# Patient Record
Sex: Female | Born: 1985 | Race: White | Hispanic: No | Marital: Single | Smoking: Former smoker
Health system: Southern US, Community
[De-identification: ages and names within clinical notes are randomized; demographics above are authoritative.]

## PROBLEM LIST (undated history)

## (undated) DIAGNOSIS — F32A Depression, unspecified: Secondary | ICD-10-CM

## (undated) DIAGNOSIS — I10 Essential (primary) hypertension: Secondary | ICD-10-CM

## (undated) DIAGNOSIS — J398 Other specified diseases of upper respiratory tract: Secondary | ICD-10-CM

## (undated) HISTORY — DX: Other specified diseases of upper respiratory tract: J39.8

## (undated) HISTORY — PX: WISDOM TOOTH EXTRACTION: SHX21

---

## 2020-05-06 NOTE — L&D Delivery Note (Signed)
Delivery Summary for Vickie Carlson  Labor Events:   Preterm labor: No data found  Rupture date: 03/27/2021  Rupture time: 12:20 PM  Rupture type: Artificial Intact  Fluid Color: Clear  Induction: No data found  Augmentation: No data found  Complications: No data found  Cervical ripening: No data found No data found   No data found     Delivery:   Episiotomy: No data found  Lacerations: No data found  Repair suture: No data found  Repair # of packets: No data found  Blood loss (ml): 1500   Information for the patient's newborn:  Jya, Hughston [[id number]]   Delivery 03/27/2021 10:01 PM by  Vaginal, Spontaneous Sex:  female Gestational Age: [redacted]w[redacted]d Delivery Clinician:   Living?:         APGARS  One minute Five minutes Ten minutes  Skin color:        Heart rate:        Grimace:        Muscle tone:        Breathing:        Totals: 7  9      Presentation/position:      Resuscitation:   Cord information:    Disposition of cord blood:     Blood gases sent?  Complications:   Placenta: Delivered:       appearance Newborn Measurements: Weight: 5 lb 7.5 oz (2480 g)  Height: 18.11"  Head circumference:    Chest circumference:    Other providers:    Additional  information: Forceps:   Vacuum:   Breech:   Observed anomalies        Delivery Note At 10:01 PM a viable and healthy female was delivered via Vaginal, Spontaneous (Presentation:  Vertex; ROA position).  APGAR: 7, 9; weight  2480 grams.   Placenta status: Spontaneous, Intact.  Cord: 3 vessels with the following complications: Body cord x 1.  Cord pH: obtained, but not sent as fetal status improved rapidly. Delayed cord clamping observed for 1 minute.  Anesthesia: Epidural Episiotomy: None Lacerations: Sulcus (left), right vaginal tear Suture Repair: 2.0 3.0 vicryl Est. Blood Loss (mL): 1500  Mom to postpartum.  Baby to Couplet care / Skin to Skin.  Hildred Laser, MD 03/27/2021, 10:50 PM

## 2020-09-07 ENCOUNTER — Other Ambulatory Visit: Payer: Self-pay

## 2020-09-07 ENCOUNTER — Emergency Department
Admission: EM | Admit: 2020-09-07 | Discharge: 2020-09-07 | Disposition: A | Discharge status: Home / Self Care | Payer: Medicaid Other | Attending: Emergency Medicine | Admitting: Emergency Medicine

## 2020-09-07 DIAGNOSIS — I10 Essential (primary) hypertension: Secondary | ICD-10-CM | POA: Insufficient documentation

## 2020-09-07 DIAGNOSIS — Z3A01 Less than 8 weeks gestation of pregnancy: Secondary | ICD-10-CM | POA: Diagnosis not present

## 2020-09-07 DIAGNOSIS — R112 Nausea with vomiting, unspecified: Secondary | ICD-10-CM

## 2020-09-07 DIAGNOSIS — O219 Vomiting of pregnancy, unspecified: Secondary | ICD-10-CM | POA: Insufficient documentation

## 2020-09-07 HISTORY — DX: Essential (primary) hypertension: I10

## 2020-09-07 LAB — URINALYSIS, COMPLETE (UACMP) WITH MICROSCOPIC
Bacteria, UA: NONE SEEN
Bilirubin Urine: NEGATIVE
Glucose, UA: NEGATIVE mg/dL
Ketones, ur: NEGATIVE mg/dL
Leukocytes,Ua: NEGATIVE
Nitrite: NEGATIVE
Protein, ur: NEGATIVE mg/dL
Specific Gravity, Urine: 1.005 (ref 1.005–1.030)
pH: 7 (ref 5.0–8.0)

## 2020-09-07 LAB — COMPREHENSIVE METABOLIC PANEL
ALT: 14 U/L (ref 0–44)
AST: 19 U/L (ref 15–41)
Albumin: 4.2 g/dL (ref 3.5–5.0)
Alkaline Phosphatase: 55 U/L (ref 38–126)
Anion gap: 10 (ref 5–15)
BUN: 6 mg/dL (ref 6–20)
CO2: 20 mmol/L — ABNORMAL LOW (ref 22–32)
Calcium: 9.1 mg/dL (ref 8.9–10.3)
Chloride: 103 mmol/L (ref 98–111)
Creatinine, Ser: 0.52 mg/dL (ref 0.44–1.00)
GFR, Estimated: >60 mL/min (ref >60)
Glucose, Bld: 89 mg/dL (ref 70–99)
Potassium: 3.8 mmol/L (ref 3.5–5.1)
Sodium: 133 mmol/L — ABNORMAL LOW (ref 135–145)
Total Bilirubin: 0.7 mg/dL (ref 0.3–1.2)
Total Protein: 7.4 g/dL (ref 6.5–8.1)

## 2020-09-07 LAB — CBC
HCT: 36.8 % (ref 36.0–46.0)
Hemoglobin: 12.5 g/dL (ref 12.0–15.0)
MCH: 30.1 pg (ref 26.0–34.0)
MCHC: 34 g/dL (ref 30.0–36.0)
MCV: 88.7 fL (ref 80.0–100.0)
Platelets: 306 10*3/uL (ref 150–400)
RBC: 4.15 MIL/uL (ref 3.87–5.11)
RDW: 12.3 % (ref 11.5–15.5)
WBC: 11.7 10*3/uL — ABNORMAL HIGH (ref 4.0–10.5)
nRBC: 0 % (ref 0.0–0.2)

## 2020-09-07 LAB — LIPASE, BLOOD: Lipase: 27 U/L (ref 11–51)

## 2020-09-07 LAB — POC URINE PREG, ED: Preg Test, Ur: POSITIVE — AB

## 2020-09-07 MED ORDER — ONDANSETRON 4 MG PO TBDP: 4.0000 mg | ORAL_TABLET | Freq: Three times a day (TID) | ORAL | 0 refills | Status: AC | PRN

## 2020-09-07 MED ORDER — SODIUM CHLORIDE 0.9 % IV BOLUS
1000.0000 mL | Freq: Once | INTRAVENOUS | Status: AC
  Administered 2020-09-07: 1000 mL via INTRAVENOUS

## 2020-09-07 MED ORDER — ONDANSETRON HCL 4 MG/2ML IJ SOLN
4.0000 mg | Freq: Once | INTRAMUSCULAR | Status: AC
  Administered 2020-09-07: 4 mg via INTRAVENOUS
  Filled 2020-09-07: qty 2

## 2020-09-07 NOTE — ED Notes (Signed)
See triage note  Pt sleeping at this time

## 2020-09-07 NOTE — ED Notes (Signed)
Pt denies any nausea at this time, able to tolerate PO prior to discharge

## 2020-09-07 NOTE — ED Triage Notes (Signed)
Pt to ER via POV with complaints of emesis, reports she has had multiple episodes over the last week. Report fatigue and increased sleeping. Pt states that she feels dehydrated.  Pt reports being [redacted] weeks pregnant.

## 2020-09-07 NOTE — Discharge Instructions (Signed)
Take Zofran up to three times daily as needed for nausea.

## 2020-09-07 NOTE — ED Provider Notes (Signed)
[facility]-EMERGENCY DEPARTMENT  ____________________________________________  Time seen: Approximately 7:24 PM  I have reviewed the triage vital signs and the nursing notes.   HISTORY  Chief Complaint Emesis During Pregnancy   Historian Patient     HPI Vickie Carlson is a 35 y.o. female G1 P0, presents to the emergency department with multiple episodes of emesis and nausea.  Patient reports that she is approximately [redacted] weeks pregnant.  She has tried Unisom at home with no relief.  Patient has had some blood-tinged emesis and became concerned.  She denies abdominal pain, vaginal bleeding or gush of vaginal fluids.  No dysuria, hematuria or increased urinary frequency.  She denies low back pain.   Past Medical History:  Diagnosis Date  . Hypertension      Immunizations up to date:  Yes.     Past Medical History:  Diagnosis Date  . Hypertension     There are no problems to display for this patient.   Past Surgical History:  Procedure Laterality Date  . WISDOM TOOTH EXTRACTION      Prior to Admission medications   Medication Sig Start Date End Date Taking? Authorizing Provider  ondansetron (ZOFRAN ODT) 4 MG disintegrating tablet Take 1 tablet (4 mg total) by mouth every 8 (eight) hours as needed for up to 5 days. 09/07/20 09/12/20 Yes Orvil Feil, PA-C    Allergies Patient has no allergy information on record.  History reviewed. No pertinent family history.  Social History Social History   Tobacco Use  . Smoking status: Never Smoker  . Smokeless tobacco: Never Used  Substance Use Topics  . Alcohol use: Not Currently  . Drug use: Never     Review of Systems  Constitutional: No fever/chills Eyes:  No discharge ENT: No upper respiratory complaints. Respiratory: no cough. No SOB/ use of accessory muscles to breath Gastrointestinal: Patient has emesis.  Musculoskeletal: Negative for musculoskeletal pain. Skin: Negative for rash, abrasions, lacerations,  ecchymosis.    ____________________________________________   PHYSICAL EXAM:  VITAL SIGNS: ED Triage Vitals  Enc Vitals Group     BP 09/07/20 1612 130/81     Pulse Rate 09/07/20 1612 84     Resp 09/07/20 1612 18     Temp 09/07/20 1612 99 F (37.2 C)     Temp Source 09/07/20 1612 Oral     SpO2 09/07/20 1612 98 %     Weight 09/07/20 1613 175 lb (79.4 kg)     Height 09/07/20 1613 5\' 2"  (1.575 m)     Head Circumference --      Peak Flow --      Pain Score 09/07/20 1613 2     Pain Loc --      Pain Edu? --      Excl. in GC? --      Constitutional: Alert and oriented. Well appearing and in no acute distress. Eyes: Conjunctivae are normal. PERRL. EOMI. Head: Atraumatic. ENT:      Nose: No congestion/rhinnorhea.      Mouth/Throat: Mucous membranes are moist.  Neck: No stridor.  No cervical spine tenderness to palpation. Cardiovascular: Normal rate, regular rhythm. Normal S1 and S2.  Good peripheral circulation. Respiratory: Normal respiratory effort without tachypnea or retractions. Lungs CTAB. Good air entry to the bases with no decreased or absent breath sounds Gastrointestinal: Bowel sounds x 4 quadrants. Soft and nontender to palpation. No guarding or rigidity. No distention. Musculoskeletal: Full range of motion to all extremities. No obvious deformities noted Neurologic:  Normal for age. No gross focal neurologic deficits are appreciated.  Skin:  Skin is warm, dry and intact. No rash noted. Psychiatric: Mood and affect are normal for age. Speech and behavior are normal.   ____________________________________________   LABS (all labs ordered are listed, but only abnormal results are displayed)  Labs Reviewed  COMPREHENSIVE METABOLIC PANEL - Abnormal; Notable for the following components:      Result Value   Sodium 133 (*)    CO2 20 (*)    All other components within normal limits  CBC - Abnormal; Notable for the following components:   WBC 11.7 (*)    All other  components within normal limits  URINALYSIS, COMPLETE (UACMP) WITH MICROSCOPIC - Abnormal; Notable for the following components:   Color, Urine STRAW (*)    APPearance CLEAR (*)    Hgb urine dipstick SMALL (*)    All other components within normal limits  POC URINE PREG, ED - Abnormal; Notable for the following components:   Preg Test, Ur POSITIVE (*)    All other components within normal limits  LIPASE, BLOOD   ____________________________________________  EKG   ____________________________________________  RADIOLOGY   No results found.  ____________________________________________    PROCEDURES  Procedure(s) performed:     Procedures     Medications  sodium chloride 0.9 % bolus 1,000 mL (0 mLs Intravenous Stopped 09/07/20 1943)  ondansetron (ZOFRAN) injection 4 mg (4 mg Intravenous Given 09/07/20 1720)     ____________________________________________   INITIAL IMPRESSION / ASSESSMENT AND PLAN / ED COURSE  Pertinent labs & imaging results that were available during my care of the patient were reviewed by me and considered in my medical decision making (see chart for details).     Assessment and Plan:   Hyperemesis 35 year old female presents to the emergency department with hyperemesis in the first trimester.  Vital signs were reassuring at triage.  On physical exam, patient was alert, active and nontoxic-appearing.  Mucous membranes appeared moist.  Patient had mild hyponatremia on CMP but was otherwise reassuring.  CMP was largely within reference range.  Urinalysis showed a small amount of blood but no findings suggestive of UTI.  Patient received a liter of normal saline and IV Zofran.  Patient reported that she felt much improved.    ____________________________________________  FINAL CLINICAL IMPRESSION(S) / ED DIAGNOSES  Final diagnoses:  Non-intractable vomiting with nausea, unspecified vomiting type      NEW MEDICATIONS STARTED DURING  THIS VISIT:  ED Discharge Orders         Ordered    ondansetron (ZOFRAN ODT) 4 MG disintegrating tablet  Every 8 hours PRN        09/07/20 1939              This chart was dictated using voice recognition software/Dragon. Despite best efforts to proofread, errors can occur which can change the meaning. Any change was purely unintentional.     Gasper Lloyd 09/07/20 2103    Minna Antis, MD 09/07/20 [phone]

## 2020-10-03 ENCOUNTER — Encounter: Payer: Medicaid Other | Admitting: Obstetrics and Gynecology

## 2020-10-05 ENCOUNTER — Encounter: Payer: Self-pay | Admitting: Obstetrics and Gynecology

## 2020-10-26 ENCOUNTER — Encounter: Payer: Medicaid Other | Admitting: Obstetrics and Gynecology

## 2020-11-09 ENCOUNTER — Encounter: Payer: Medicaid Other | Admitting: Obstetrics and Gynecology

## 2020-11-15 ENCOUNTER — Other Ambulatory Visit: Payer: Self-pay

## 2020-11-15 ENCOUNTER — Ambulatory Visit (INDEPENDENT_AMBULATORY_CARE_PROVIDER_SITE_OTHER): Payer: Medicaid Other | Admitting: Obstetrics and Gynecology

## 2020-11-15 ENCOUNTER — Encounter: Payer: Self-pay | Admitting: Obstetrics and Gynecology

## 2020-11-15 VITALS — BP 124/87 | HR 93 | Ht 62.0 in | Wt 190.6 lb

## 2020-11-15 DIAGNOSIS — O0932 Supervision of pregnancy with insufficient antenatal care, second trimester: Secondary | ICD-10-CM

## 2020-11-15 DIAGNOSIS — Z3A18 18 weeks gestation of pregnancy: Secondary | ICD-10-CM

## 2020-11-15 DIAGNOSIS — R45851 Suicidal ideations: Secondary | ICD-10-CM | POA: Diagnosis not present

## 2020-11-15 DIAGNOSIS — Z3402 Encounter for supervision of normal first pregnancy, second trimester: Secondary | ICD-10-CM | POA: Diagnosis not present

## 2020-11-15 DIAGNOSIS — O99342 Other mental disorders complicating pregnancy, second trimester: Secondary | ICD-10-CM | POA: Diagnosis not present

## 2020-11-15 DIAGNOSIS — Z7689 Persons encountering health services in other specified circumstances: Secondary | ICD-10-CM | POA: Diagnosis not present

## 2020-11-15 NOTE — Progress Notes (Signed)
HPI:      Ms. Vickie Carlson is a 35 y.o. G1P0 who LMP was Patient's last menstrual period was 07/14/2020.  Subjective:   She presents today to establish prenatal care.  Patient is approximately 18 weeks estimated gestational age by last menstrual period.  She has previously missed more than 2 appointments. She states she is taking prenatal vitamins. She reports that she has had some depression during this pregnancy.  She says that prior in the pregnancy she had some suicidal thoughts.  She does not have a plan.  She reports that she has not had this issue in more than 2 weeks.  She reports that when it occurs it is fleeting and not constant.    Hx: The following portions of the patient's history were reviewed and updated as appropriate:             She  has a past medical history of Hypertension. She does not have a problem list on file. She  has a past surgical history that includes Wisdom tooth extraction. Her family history is not on file. She  reports that she has quit smoking. Her smoking use included cigarettes. She has never used smokeless tobacco. She reports previous alcohol use. She reports that she does not use drugs. She has a current medication list which includes the following prescription(s): venlafaxine xr. She has No Known Allergies.       Review of Systems:  Review of Systems  Constitutional: Denied constitutional symptoms, night sweats, recent illness, fatigue, fever, insomnia and weight loss.  Eyes: Denied eye symptoms, eye pain, photophobia, vision change and visual disturbance.  Ears/Nose/Throat/Neck: Denied ear, nose, throat or neck symptoms, hearing loss, nasal discharge, sinus congestion and sore throat.  Cardiovascular: Denied cardiovascular symptoms, arrhythmia, chest pain/pressure, edema, exercise intolerance, orthopnea and palpitations.  Respiratory: Denied pulmonary symptoms, asthma, pleuritic pain, productive sputum, cough, dyspnea and wheezing.   Gastrointestinal: Denied, gastro-esophageal reflux, melena, nausea and vomiting.  Genitourinary: Denied genitourinary symptoms including symptomatic vaginal discharge, pelvic relaxation issues, and urinary complaints.  Musculoskeletal: Denied musculoskeletal symptoms, stiffness, swelling, muscle weakness and myalgia.  Dermatologic: Denied dermatology symptoms, rash and scar.  Neurologic: Denied neurology symptoms, dizziness, headache, neck pain and syncope.  Psychiatric: Denied psychiatric symptoms, anxiety and depression.  Endocrine: Denied endocrine symptoms including hot flashes and night sweats.   Meds:   Current Outpatient Medications on File Prior to Visit  Medication Sig Dispense Refill   venlafaxine XR (EFFEXOR-XR) 75 MG 24 hr capsule Take 75 mg by mouth daily.     No current facility-administered medications on file prior to visit.          Objective:     Vitals:   11/15/20 1012  BP: 124/87  Pulse: 93   Filed Weights   11/15/20 1012  Weight: 190 lb 9.6 oz (86.5 kg)              Fundal height U = U  FHT's 142  Assessment:    G1P0 There are no problems to display for this patient.    1. Encounter to establish care   2. Encounter for supervision of normal first pregnancy in second trimester   3. Limited prenatal care in second trimester     Patient has not had any prenatal care and she is now partway through the second trimester.  She has missed several appointments.  Approximately 18 weeks estimated gestational age.  Occasional fleeting suicidal ideation-nothing recently   Plan:  Prenatal Plan 1.  The patient was given prenatal literature. 2.  She was continued on prenatal vitamins. 3.  A prenatal lab panel to be drawn at nurse visit. 4.  An ultrasound was ordered to better determine an [facility] and for fetal anatomy. 5.  A nurse visit was scheduled. 6.  Genetic testing and testing for other inheritable conditions discussed in detail.  7.  A  general overview of pregnancy testing, visit schedule, ultrasound schedule, and prenatal care was discussed. 8.  COVID and its risks associated with pregnancy, prevention by limiting exposure and use of masks, as well as the risks and benefits of vaccination during pregnancy were discussed in detail.  [facility] policy regarding office and hospital visitation and testing was explained. 9.  Prenatal blood work today 10.  Patient to [facility] in the next 2 to 3 days -walk-in to establish care and discuss occasional suicidal thoughts 11.  aFP today  Orders Orders Placed This Encounter  Procedures   Culture, OB Urine   GC/Chlamydia Probe Amp   US OB Comp + 14 Wk   ABO AND RH    CBC with Differential/Platelet   HIV Antibody (routine testing w rflx)   Monitor Drug Profile 14(MW)   Nicotine screen, urine   RPR   Rubella screen   Urinalysis, Routine w reflex microscopic   Varicella zoster antibody, IgG   Viral Hepatitis HBV, HCV   AFP, Serum, Open Spina Bifida   MaterniT21  plus Core+ESS+SCA, Blood   Antibody screen         F/U  Return in about 4 weeks (around 12/13/2020). I spent 33 minutes involved in the care of this patient preparing to see the patient by obtaining and reviewing her medical history (including labs, imaging tests and prior procedures), documenting clinical information in the electronic health record (EHR), counseling and coordinating care plans, writing and sending prescriptions, ordering tests or procedures and directly communicating with the patient by discussing pertinent items from her history and physical exam as well as detailing my assessment and plan as noted above so that she has an informed understanding.  All of her questions were answered.  Elonda Husky, M.D. 11/15/2020 10:45 AM

## 2020-11-16 LAB — URINALYSIS, ROUTINE W REFLEX MICROSCOPIC
Bilirubin, UA: NEGATIVE
Glucose, UA: NEGATIVE
Ketones, UA: NEGATIVE
Leukocytes,UA: NEGATIVE
Nitrite, UA: NEGATIVE
Protein,UA: NEGATIVE
RBC, UA: NEGATIVE
Specific Gravity, UA: 1.019 (ref 1.005–1.030)
Urobilinogen, Ur: 0.2 mg/dL (ref 0.2–1.0)
pH, UA: 5.5 (ref 5.0–7.5)

## 2020-11-17 ENCOUNTER — Telehealth: Payer: Self-pay

## 2020-11-17 LAB — CBC WITH DIFFERENTIAL/PLATELET
Basophils Absolute: 0.1 10*3/uL (ref 0.0–0.2)
Basos: 0 %
EOS (ABSOLUTE): 0.4 10*3/uL (ref 0.0–0.4)
Eos: 3 %
Hematocrit: 38.1 % (ref 34.0–46.6)
Hemoglobin: 12.4 g/dL (ref 11.1–15.9)
Immature Grans (Abs): 0.1 10*3/uL (ref 0.0–0.1)
Immature Granulocytes: 1 %
Lymphocytes Absolute: 3.4 10*3/uL — ABNORMAL HIGH (ref 0.7–3.1)
Lymphs: 23 %
MCH: 30 pg (ref 26.6–33.0)
MCHC: 32.5 g/dL (ref 31.5–35.7)
MCV: 92 fL (ref 79–97)
Monocytes Absolute: 0.8 10*3/uL (ref 0.1–0.9)
Monocytes: 6 %
Neutrophils Absolute: 9.9 10*3/uL — ABNORMAL HIGH (ref 1.4–7.0)
Neutrophils: 67 %
Platelets: 283 10*3/uL (ref 150–450)
RBC: 4.14 x10E6/uL (ref 3.77–5.28)
RDW: 12.4 % (ref 11.7–15.4)
WBC: 14.6 10*3/uL — ABNORMAL HIGH (ref 3.4–10.8)

## 2020-11-17 LAB — MONITOR DRUG PROFILE 14(MW)
Amphetamine Scrn, Ur: NEGATIVE ng/mL
BARBITURATE SCREEN URINE: NEGATIVE ng/mL
BENZODIAZEPINE SCREEN, URINE: NEGATIVE ng/mL
Buprenorphine, Urine: NEGATIVE ng/mL
CANNABINOIDS UR QL SCN: NEGATIVE ng/mL
Cocaine (Metab) Scrn, Ur: NEGATIVE ng/mL
Creatinine(Crt), U: 107.7 mg/dL (ref 20.0–300.0)
Fentanyl, Urine: NEGATIVE pg/mL
Meperidine Screen, Urine: NEGATIVE ng/mL
Methadone Screen, Urine: NEGATIVE ng/mL
OXYCODONE+OXYMORPHONE UR QL SCN: NEGATIVE ng/mL
Opiate Scrn, Ur: NEGATIVE ng/mL
Ph of Urine: 5.4 (ref 4.5–8.9)
Phencyclidine Qn, Ur: NEGATIVE ng/mL
Propoxyphene Scrn, Ur: NEGATIVE ng/mL
SPECIFIC GRAVITY: 1.023
Tramadol Screen, Urine: NEGATIVE ng/mL

## 2020-11-17 LAB — AFP, SERUM, OPEN SPINA BIFIDA
AFP MoM: 0.8
AFP Value: 31.6 ng/mL
Gest. Age on Collection Date: 18 weeks
Maternal Age At EDD: 34.3 yr
OSBR Risk 1 IN: 10000
Test Results:: NEGATIVE
Weight: 190 [lb_av]

## 2020-11-17 LAB — RPR: RPR Ser Ql: NONREACTIVE

## 2020-11-17 LAB — ABO AND RH: Rh Factor: POSITIVE

## 2020-11-17 LAB — HIV ANTIBODY (ROUTINE TESTING W REFLEX): HIV Screen 4th Generation wRfx: NONREACTIVE

## 2020-11-17 LAB — NICOTINE SCREEN, URINE: Cotinine Ql Scrn, Ur: NEGATIVE ng/mL

## 2020-11-17 LAB — CULTURE, OB URINE

## 2020-11-17 LAB — VIRAL HEPATITIS HBV, HCV
HCV Ab: 0.1 s/co ratio (ref 0.0–0.9)
Hep B Core Total Ab: NEGATIVE
Hep B Surface Ab, Qual: NONREACTIVE
Hepatitis B Surface Ag: NEGATIVE

## 2020-11-17 LAB — VARICELLA ZOSTER ANTIBODY, IGG: Varicella zoster IgG: <135 index — ABNORMAL LOW (ref >165)

## 2020-11-17 LAB — URINE CULTURE, OB REFLEX

## 2020-11-17 LAB — HCV INTERPRETATION

## 2020-11-17 LAB — RUBELLA SCREEN: Rubella Antibodies, IGG: <0.9 index — ABNORMAL LOW (ref >0.99)

## 2020-11-17 LAB — ANTIBODY SCREEN: Antibody Screen: NEGATIVE

## 2020-11-17 NOTE — Telephone Encounter (Signed)
Pt has question about recent labs. Pls advise.

## 2020-11-17 NOTE — Telephone Encounter (Signed)
Please advise on results

## 2020-11-19 LAB — MATERNIT21  PLUS CORE+ESS+SCA, BLOOD

## 2020-11-19 LAB — GC/CHLAMYDIA PROBE AMP
Chlamydia trachomatis, NAA: NEGATIVE
Neisseria Gonorrhoeae by PCR: NEGATIVE

## 2020-11-20 ENCOUNTER — Encounter: Payer: Self-pay | Admitting: Obstetrics and Gynecology

## 2020-11-20 NOTE — Telephone Encounter (Signed)
LM for patient to return call.

## 2020-11-22 NOTE — Telephone Encounter (Signed)
LM for patient to return call.

## 2020-12-01 ENCOUNTER — Other Ambulatory Visit: Payer: Self-pay

## 2020-12-01 ENCOUNTER — Ambulatory Visit
Admission: RE | Admit: 2020-12-01 | Discharge: 2020-12-01 | Disposition: A | Discharge status: Home / Self Care | Payer: Medicaid Other | Source: Ambulatory Visit | Attending: Obstetrics and Gynecology | Admitting: Obstetrics and Gynecology

## 2020-12-01 ENCOUNTER — Ambulatory Visit: Payer: Medicaid Other

## 2020-12-01 VITALS — BP 123/85 | HR 98 | Ht 62.0 in | Wt 193.9 lb

## 2020-12-01 DIAGNOSIS — Z3A20 20 weeks gestation of pregnancy: Secondary | ICD-10-CM

## 2020-12-01 DIAGNOSIS — R638 Other symptoms and signs concerning food and fluid intake: Secondary | ICD-10-CM

## 2020-12-01 DIAGNOSIS — Z3402 Encounter for supervision of normal first pregnancy, second trimester: Secondary | ICD-10-CM

## 2020-12-01 NOTE — Progress Notes (Signed)
      Vickie Carlson presents for NOB nurse intake visit. Pregnancy confirmation done at [facility], 09/07/2020, with Minna Antis, MD.  [name].  P0.  LMP 07/14/2020.  EDD 04/20/2021.  [place] [redacted]w[redacted]d. Pregnancy education material explained and given.  5 cats in the home.  NOB labs ordered. BMI greater than 30. TSH/HbgA1c ordered. Sickle cell not ordered due to race. HIV and drug screen explained and ordered. Genetic screening discussed. Genetic testing; completed. PNV encouraged. Pt to follow up with provider in 4 weeks for NOB physical.  FMLA, Drug/HIV screening, [facility] Care Financial Policy forms all reviewed and signed by patient.

## 2020-12-01 NOTE — Patient Instructions (Signed)
WHAT OB PATIENTS CAN EXPECT  Confirmation of pregnancy and ultrasound ordered if medically indicated-[redacted] weeks gestation New OB (NOB) intake with nurse and New OB (NOB) labs- [redacted] weeks gestation New OB (NOB) physical examination with provider- 11/[redacted] weeks gestation Flu vaccine-[redacted] weeks gestation Anatomy scan-[redacted] weeks gestation Glucose tolerance test, blood work to test for anemia, T-dap vaccine-[redacted] weeks gestation Vaginal swabs/cultures-STD/Group B strep-[redacted] weeks gestation Appointments every 4 weeks until 28 weeks Every 2 weeks from 28 weeks until 36 weeks Weekly visits from 36 weeks until delivery  Tests and Screening During Pregnancy Having certain tests and screenings during pregnancy is an important part of your prenatal care. These tests help your health care provider find problems that might affect your pregnancy. Some tests must be done for all pregnant women, and some are optional. Most of the tests and screenings do not pose any risks for you or your baby. You may need additional testing if any routinetests indicate a problem. Tests and screenings done early in pregnancy Some tests and screenings you can expect to have in early pregnancy include: Blood tests, such as: Complete blood count (CBC). This test is done to check your red and white blood cells. It can help identify a risk for anemia, infection, or bleeding. Blood typing. This test shows your blood type. It also shows whether you have a certain protein in your red blood cells called the Rh factor. It can be dangerous for your baby if you do not have this protein (Rh negative) and your baby has it (Rh positive). Tests to check for diseases that can cause birth defects or can be passed to your baby, such as: [place] measles (rubella) and chicken pox. The test indicates whether you are immune to these diseases. Hepatitis B and C. Human Immunodeficiency Virus (HIV). Syphilis. Zika virus, if you or your partner has traveled to an area  where the virus occurs. Urine testing. This checks for sugar in your urine and for signs of infection. Blood pressure. This is to check for high blood pressure and preeclampsia. Testing for sexually transmitted infections (STIs), such as chlamydia or gonorrhea. Testing for tuberculosis. You may have this skin test if you are at risk for tuberculosis. Fetal ultrasound. This is an imaging study of your growing baby. It uses sound waves to create pictures of your baby. This test may be done to help determine your due date and to ensure you do not have an ectopic pregnancy. An ectopic pregnancy is a pregnancy that grows outside of the uterus. Tests and screenings done later in pregnancy Certain tests are done for the first time later in the pregnancy. Some of the tests that were done in early pregnancy are repeated at this time. Some common tests you can expect to have later in pregnancy include: Rh antibody testing. If you are Rh negative, you will have a blood test at about 28 weeks of pregnancy to see if you are producing Rh antibodies. If you have not started to make antibodies, you will be given an injection to prevent you from making antibodies for the rest of your pregnancy. Glucose screening. This checks your blood sugar. It will show whether you are developing the type of diabetes that occurs during pregnancy (gestational diabetes). You may have this screening earlier if you have risk factors for diabetes. Screening for group B streptococcus (GBS). GBS is a type of bacteria that may live in your rectum or vagina. GBS can spread to your baby during birth. This  is done at 35-37 weeks of pregnancy. If testing is positive for GBS, you may be treated with antibiotic medicine. Urine and blood tests to monitor for other pregnancy problems, such as preeclampsia or anemia. Blood pressure to monitor for high blood pressure and preeclampsia. Fetal ultrasound. This may be repeated at 16-20 weeks to check how  your baby is growing and developing. Non-stress test. This test is done later in pregnancy to check your baby's heart rate. This may be repeated weekly if your pregnancy is high risk. Biophysical profile. This test includes ultrasound imaging and a non-stress test to ensure your baby is healthy. This test may help decide when your baby should be born. Screening for birth defects Some birth defects are caused by abnormal genes passed down through families. Early in your pregnancy, tests can be done to find out if your baby is at risk for a genetic disorder. This testing is optional. The type of testing recommended for you will depend on your family and medical history, your ethnicity, and your age. Testing may include: Screening tests. These tests may include an ultrasound, blood tests, or a combination of both. The blood tests are used to check for abnormal genes and the ultrasound is done to look for early birth defects. Carrier screening. This test involves checking the blood or saliva of both parents to see if they carry abnormal genes that could be passed down to a baby. If genetic screening shows that your baby is at risk for a genetic defect, additional diagnostic testing may be recommended, such as: Amniocentesis. This involves testing a sample of fluid from your womb (amniotic fluid). Chorionic villus sampling. In this test, a sample of cells from your placenta is checked for abnormal cells. Unlike other tests done during pregnancy, diagnostic testing does have some risk for your pregnancy. Talk to your health care provider about the risks andbenefits of genetic testing. Questions to ask your health care provider What routine tests are recommended for me? When and how will these tests be done? When will I get the results of routine tests? What do the results of these tests mean for me or my baby? Do you recommend any genetic screening tests? Which ones? Should I see a genetic counselor  before having genetic screening? Where to find more information American Pregnancy Association: americanpregnancy.org/prenatal-testing [employer]ians [employer] Gynecologists: JewelryExec.com.pt Office on [employer]: KeywordPortfolios.com.br [employer]: marchofdimes.org/pregnancy Summary Having certain tests and screenings during pregnancy is an important part of your prenatal care. Talk to your health care provider about what tests are right for you and your baby. In early pregnancy, testing may be done to check your risks for various conditions that can affect you and your baby. Later in pregnancy, tests may be done to ensure that your baby is growing normally and that you and your baby are staying healthy during the pregnancy. Genetic testing is optional. Talk to your health care provider about the risks and benefits of genetic testing. This information is not intended to replace advice given to you by your health care provider. Make sure you discuss any questions you have with your healthcare provider. Document Revised: 01/11/2020 Document Reviewed: 01/11/2020 Elsevier Patient Education  [place]. Morning Sickness  Morning sickness is when you feel like you may vomit (feel nauseous) during pregnancy. Sometimes, you may vomit. Morning sickness most often happens in the morning, but it can also happen at any time of the day. Some women may have morning  sickness that makes them vomit all the time. This is amore serious problem that needs treatment. What are the causes? The cause of this condition is not known. What increases the risk? You had vomiting or a feeling like you may vomit before your pregnancy. You had morning sickness in another pregnancy. You are pregnant with more than one baby, such as twins. What are the signs or symptoms? Feeling like you may vomit. Vomiting. How is this treated? Treatment is usually not needed for this condition. You may only  need to change what you eat. In some cases, your doctor may give you some things to take for your condition. These include: Vitamin B6 supplements. Medicines to treat the feeling that you may vomit. Ginger. Follow these instructions at home: Medicines Take over-the-counter and prescription medicines only as told by your doctor. Do not take any medicines until you talk with your doctor about them first. Take multivitamins before you get pregnant. These can stop or lessen the symptoms of morning sickness. Eating and drinking Eat dry toast or crackers before getting out of bed. Eat 5 or 6 small meals a day. Eat dry and bland foods like rice and baked potatoes. Do not eat greasy, fatty, or spicy foods. Have someone cook for you if the smell of food causes you to vomit or to feel like you may vomit. If you feel like you may vomit after taking prenatal vitamins, take them at night or with a snack. Eat protein foods when you need a snack. Nuts, yogurt, and cheese are good choices. Drink fluids throughout the day. Try ginger ale made with real ginger, ginger tea made from fresh grated ginger, or ginger candies. General instructions Do not smoke or use any products that contain nicotine or tobacco. If you need help quitting, ask your doctor. Use an air purifier to keep the air in your house free of smells. Get lots of fresh air. Try to avoid smells that make you feel sick. Try wearing an acupressure wristband. This is a wristband that is used to treat seasickness. Try a treatment called acupuncture. In this treatment, a doctor puts needles into certain areas of your body to make you feel better. Contact a doctor if: You need medicine to feel better. You feel dizzy or light-headed. You are losing weight. Get help right away if: The feeling that you may vomit will not go away, or you cannot stop vomiting. You faint. You have very bad pain in your belly. Summary Morning sickness is when you  feel like you may vomit (feel nauseous) during pregnancy. You may feel sick in the morning, but you can feel this way at any time of the day. Making some changes to what you eat may help your symptoms go away. This information is not intended to replace advice given to you by your health care provider. Make sure you discuss any questions you have with your healthcare provider. Document Revised: 12/06/2019 Document Reviewed: 11/15/2019 Elsevier Patient Education  2022 [employer]. How a Baby Grows During Pregnancy Pregnancy begins when a female's sperm enters a female's egg. This is called fertilization. Fertilization usually happens in one of the fallopian tubes that connect the ovaries to the uterus. The fertilized egg moves down the fallopian tube to the uterus. Once it reaches the uterus, it implants into the lining ofthe uterus and begins to grow. For the first 8 weeks, the fertilized egg is called an embryo. After 8 weeks, it is called a fetus. As  the fetus continues to grow, it receives oxygen and nutrients through the placenta, which is an organ that grows to support the developing baby. The placenta is the life support system for the baby. Itprovides oxygen and nutrition and removes waste. How long does a typical pregnancy last? A pregnancy usually lasts 280 days, or about 40 weeks. Pregnancy is divided into three periods of growth, also called trimesters: First trimester: 0-12 weeks. Second trimester: 13-27 weeks. Third trimester: 28-40 weeks. The day when your baby is ready to be born (full term) is your estimated date of delivery. However, most babies are not born ontheir estimated date of delivery. How does my baby develop month by month?  First month The fertilized egg attaches to the inside of the uterus. Some cells will form the placenta. Others will form the fetus. The arms, legs, brain, spinal cord, lungs, and heart begin to develop. At the end of the first month, the heart  begins to beat. Second month The bones, inner ear, eyelids, hands, and feet form. The genitals develop. By the end of 8 weeks, all major organs are developing. Third month All of the internal organs are forming. Teeth develop below the gums. Bones and muscles begin to grow. The spine can flex. The skin is transparent. Fingernails and toenails begin to form. Arms and legs continue to grow longer, and hands and feet develop. The fetus is about 3 inches (7.6 cm) long. Fourth month The placenta is completely formed. The external sex organs, neck, outer ear, eyebrows, eyelids, and fingernails are formed. The fetus can hear, swallow, and move its arms and legs. The kidneys begin to produce urine. The skin is covered with a white, waxy coating (vernix) and very fine hair (lanugo). Fifth month The fetus moves around more and can be felt for the first time (quickening). The fetus starts to sleep and wake up and may begin to suck a finger. The nails grow to the end of the fingers. The organ in the digestive system that makes bile (gallbladder) functions and helps to digest nutrients. If the fetus is a female, eggs are present in the ovaries. If the fetus is a female, testicles start to move down into the scrotum. Sixth month The lungs are formed. The eyes open. The brain continues to develop. Your baby has fingerprints and toe prints. Your baby's hair grows thicker. At the end of the second trimester, the fetus is about 9 inches (22.9 cm) long. Seventh month The fetus kicks and stretches. The eyes are developed enough to sense changes in light. The hands can make a grasping motion. The fetus responds to sound. Eighth month Most organs and body systems are fully developed and functioning. Bones harden, and taste buds develop. The fetus may hiccup. Certain areas of the brain are still developing. The skull remains soft. Ninth month The fetus gains about  lb (0.23 kg) each week. The lungs  are fully developed. Patterns of sleep develop. The fetus's head typically moves into a head-down position (vertex) in the uterus to prepare for birth. The fetus weighs 6-9 lb (2.72-4.08 kg) and is 19-20 inches (48.26-50.8 cm) long. How do I know if my baby is developing well? Always talk with your health care provider about any concerns that you may have about your pregnancy and your baby. At each prenatal visit, your health care provider will do several different tests to check on your health and keep track of your baby's development. These include: Fundal height and position.  To do this, your health care provider will: Measure your growing belly from your pubic bone to the top of the uterus using a tape measure. Feel your belly to determine your baby's position. Heartbeat. An ultrasound in the first trimester can confirm pregnancy and show a heartbeat, depending on how far along you are. Your health care provider will check your baby's heart rate at every prenatal visit. You will also have a second trimester ultrasound to check your baby'sdevelopment. Follow these instructions at home: Take prenatal vitamins as told by your health care provider. These include vitamins such as folic acid, iron, calcium, and vitamin D. They are important for healthy development. Take over-the-counter and prescription medicines only as told by your health care provider. Keep all follow-up visits. This is important. Follow-up visits include prenatal care and screening tests. Summary A pregnancy usually lasts 280 days, or about 40 weeks. Pregnancy is divided into three periods of growth, also called trimesters. Your health care provider will monitor your baby's growth and development throughout your pregnancy. Follow your health care provider's recommendations about taking prenatal vitamins and medicines during your pregnancy. Talk with your health care provider if you have any concerns about your pregnancy or your  developing baby. This information is not intended to replace advice given to you by your health care provider. Make sure you discuss any questions you have with your healthcare provider. Document Revised: 09/29/2019 Document Reviewed: 08/05/2019 Elsevier Patient Education  2022 [place]. Commonly Asked Questions During Pregnancy  Cats: A parasite can be excreted in cat feces.  To avoid exposure you need to have another person empty the little box.  If you must empty the litter box you will need to wear gloves.  Wash your hands after handling your cat.  This parasite can also be found in raw or undercooked meat so this should also be avoided.  Colds, Sore Throats, Flu: Please check your medication sheet to see what you can take for symptoms.  If your symptoms are unrelieved by these medications please call the office.  Dental Work: Most any dental work Investment banker, corporate recommends is permitted.  X-rays should only be taken during the first trimester if absolutely necessary.  Your abdomen should be shielded with a lead apron during all x-rays.  Please notify your provider prior to receiving any x-rays.  Novocaine is fine; gas is not recommended.  If your dentist requires a note from [place] prior to dental work please call the office and we will provide one for you.  Exercise: Exercise is an important part of staying healthy during your pregnancy.  You may continue most exercises you were accustomed to prior to pregnancy.  Later in your pregnancy you will most likely notice you have difficulty with activities requiring balance like riding a bicycle.  It is important that you listen to your body and avoid activities that put you at a higher risk of falling.  Adequate rest and staying well hydrated are a must!  If you have questions about the safety of specific activities ask your provider.    Exposure to Children with illness: Try to avoid obvious exposure; report any symptoms to [place] when noted,  If you have  chicken pos, red measles or mumps, you should be immune to these diseases.   Please do not take any vaccines while pregnant unless you have checked with your OB provider.  Fetal Movement: After 28 weeks we recommend you do "kick counts" twice daily.  Lie or sit  down in a calm quiet environment and count your baby movements "kicks".  You should feel your baby at least 10 times per hour.  If you have not felt 10 kicks within the first hour get up, walk around and have something sweet to eat or drink then repeat for an additional hour.  If count remains less than 10 per hour notify your provider.  Fumigating: Follow your pest control agent's advice as to how long to stay out of your home.  Ventilate the area well before re-entering.  Hemorrhoids:   Most over-the-counter preparations can be used during pregnancy.  Check your medication to see what is safe to use.  It is important to use a stool softener or fiber in your diet and to drink lots of liquids.  If hemorrhoids seem to be getting worse please call the office.   Hot Tubs:  Hot tubs Jacuzzis and saunas are not recommended while pregnant.  These increase your internal body temperature and should be avoided.  Intercourse:  Sexual intercourse is safe during pregnancy as long as you are comfortable, unless otherwise advised by your provider.  Spotting may occur after intercourse; report any bright red bleeding that is heavier than spotting.  Labor:  If you know that you are in labor, please go to the hospital.  If you are unsure, please call the office and let us help you decide what to do.  Lifting, straining, etc:  If your job requires heavy lifting or straining please check with your provider for any limitations.  Generally, you should not lift items heavier than that you can lift simply with your hands and arms (no back muscles)  Painting:  Paint fumes do not harm your pregnancy, but may make you ill and should be avoided if possible.  Latex or  water based paints have less odor than oils.  Use adequate ventilation while painting.  Permanents & Hair Color:  Chemicals in hair dyes are not recommended as they cause increase hair dryness which can increase hair loss during pregnancy.  " Highlighting" and permanents are allowed.  Dye may be absorbed differently and permanents may not hold as well during pregnancy.  Sunbathing:  Use a sunscreen, as skin burns easily during pregnancy.  Drink plenty of fluids; avoid over heating.  Tanning Beds:  Because their possible side effects are still unknown, tanning beds are not recommended.  Ultrasound Scans:  Routine ultrasounds are performed at approximately 20 weeks.  You will be able to see your baby's general anatomy an if you would like to know the gender this can usually be determined as well.  If it is questionable when you conceived you may also receive an ultrasound early in your pregnancy for dating purposes.  Otherwise ultrasound exams are not routinely performed unless there is a medical necessity.  Although you can request a scan we ask that you pay for it when conducted because insurance does not cover " patient request" scans.  Work: If your pregnancy proceeds without complications you may work until your due date, unless your physician or employer advises otherwise.  Round Ligament Pain/Pelvic Discomfort:  Sharp, shooting pains not associated with bleeding are fairly common, usually occurring in the second trimester of pregnancy.  They tend to be worse when standing up or when you remain standing for long periods of time.  These are the result of pressure of certain pelvic ligaments called "round ligaments".  Rest, Tylenol and heat seem to be the most effective relief.  As the womb and fetus grow, they rise out of the pelvis and the discomfort improves.  Please notify the office if your pain seems different than that described.  It may represent a more serious condition.  Common Medications  Safe in Pregnancy  Acne:      Constipation:  Benzoyl Peroxide     Colace  Clindamycin      Dulcolax Suppository  Topica Erythromycin     Fibercon  Salicylic Acid      Metamucil         Miralax AVOID:        Senakot   Accutane    Cough:  Retin-A       Cough Drops  Tetracycline      Phenergan w/ Codeine if Rx  Minocycline      Robitussin (Plain & DM)  Antibiotics:     Crabs/Lice:  Ceclor       RID  Cephalosporins    AVOID:  E-Mycins      Kwell  Keflex  Macrobid/Macrodantin   Diarrhea:  Penicillin      Kao-Pectate  Zithromax      Imodium AD         PUSH FLUIDS AVOID:       Cipro     Fever:  Tetracycline      Tylenol (Regular or Extra  Minocycline       Strength)  Levaquin      Extra Strength-Do not          Exceed 8 tabs/24 hrs Caffeine:        <231m/day (equiv. To 1 cup of coffee or  approx. 3 12 oz sodas)         Gas: Cold/Hayfever:       Gas-X  Benadryl      Mylicon  Claritin       Phazyme  **Claritin-D        Chlor-Trimeton    Headaches:  Dimetapp      ASA-Free Excedrin  Drixoral-Non-Drowsy     Cold Compress  Mucinex (Guaifenasin)     Tylenol (Regular or Extra  Sudafed/Sudafed-12 Hour     Strength)  **Sudafed PE Pseudoephedrine   Tylenol Cold & Sinus     Vicks Vapor Rub  Zyrtec  **AVOID if Problems With Blood Pressure         Heartburn: Avoid lying down for at least 1 hour after meals  Aciphex      Maalox     Rash:  Milk of Magnesia     Benadryl    Mylanta       1% Hydrocortisone Cream  Pepcid  Pepcid Complete   Sleep Aids:  Prevacid      Ambien   Prilosec       Benadryl  Rolaids       Chamomile Tea  Tums (Limit 4/day)     Unisom         Tylenol PM         Warm milk-add vanilla or  Hemorrhoids:       Sugar for taste  Anusol/Anusol H.C.  (RX: Analapram 2.5%)  Sugar Substitutes:  Hydrocortisone OTC     Ok in moderation  Preparation H      Tucks        Vaseline lotion applied to tissue with  wiping    Herpes:     Throat:  Acyclovir      Oragel  Famvir  Valtrex     Vaccines:  Flu Shot Leg Cramps:       *Gardasil  Benadryl      Hepatitis A         Hepatitis B Nasal Spray:       Pneumovax  Saline Nasal Spray     Polio Booster         Tetanus Nausea:       Tuberculosis test or PPD  Vitamin B6 25 mg TID   AVOID:    Dramamine      *Gardasil  Emetrol       Live Poliovirus  Ginger Root 250 mg QID    MMR (measles, mumps &  High Complex Carbs @ Bedtime    rebella)  Sea Bands-Accupressure    Varicella (Chickenpox)  Unisom 1/2 tab TID     *No known complications           If received before Pain:         Known pregnancy;   Darvocet       Resume series after  Lortab        Delivery  Percocet    Yeast:   Tramadol      Femstat  Tylenol 3      Gyne-lotrimin  Ultram       Monistat  Vicodin           MISC:         All Sunscreens           Hair Coloring/highlights          Insect Repellant's          (Including DEET)         Mystic Tans

## 2020-12-02 LAB — HEMOGLOBIN A1C
Est. average glucose Bld gHb Est-mCnc: 94 mg/dL
Hgb A1c MFr Bld: 4.9 % (ref 4.8–5.6)

## 2020-12-02 LAB — TOXOPLASMA ANTIBODIES- IGG AND  IGM
Toxoplasma Antibody- IgM: <3 AU/mL (ref 0.0–7.9)
Toxoplasma IgG Ratio: <3 IU/mL (ref 0.0–7.1)

## 2020-12-02 LAB — TSH: TSH: 1.42 u[IU]/mL (ref 0.450–4.500)

## 2020-12-11 NOTE — Patient Instructions (Signed)
Second Trimester of Pregnancy  The second trimester of pregnancy is from week 13 through week 27. This is also called months 4 through 6 of pregnancy. This is often the time when you feelyour best. During the second trimester: Morning sickness is less or has stopped. You may have more energy. You may feel hungry more often. At this time, your unborn baby (fetus) is growing very fast. At the end of the sixth month, the unborn baby may be up to 12 inches long and weigh about 1 pounds. You will likely start to feelthe baby move between 16 and 20 weeks of pregnancy. Body changes during your second trimester Your body continues to go through many changes during this time. The changesvary and generally return to normal after the baby is born. Physical changes You will gain more weight. You may start to get stretch marks on your hips, belly (abdomen), and breasts. Your breasts will grow and may hurt. Dark spots or blotches may develop on your face. A dark line from your belly button to the pubic area (linea nigra) may appear. You may have changes in your hair. Health changes You may have headaches. You may have heartburn. You may have trouble pooping (constipation). You may have hemorrhoids or swollen, bulging veins (varicose veins). Your gums may bleed. You may pee (urinate) more often. You may have back pain. Follow these instructions at home: Medicines Take over-the-counter and prescription medicines only as told by your doctor. Some medicines are not safe during pregnancy. Take a prenatal vitamin that contains at least 600 micrograms (mcg) of folic acid. Eating and drinking Eat healthy meals that include: Fresh fruits and vegetables. Whole grains. Good sources of protein, such as meat, eggs, or tofu. Low-fat dairy products. Avoid raw meat and unpasteurized juice, milk, and cheese. You may need to take these actions to prevent or treat trouble pooping: Drink enough fluids to keep  your pee (urine) pale yellow. Eat foods that are high in fiber. These include beans, whole grains, and fresh fruits and vegetables. Limit foods that are high in fat and sugar. These include fried or sweet foods. Activity Exercise only as told by your doctor. Most people can do their usual exercise during pregnancy. Try to exercise for 30 minutes at least 5 days a week. Stop exercising if you have pain or cramps in your belly or lower back. Do not exercise if it is too hot or too humid, or if you are in a place of great height (high altitude). Avoid heavy lifting. If you choose to, you may have sex unless your doctor tells you not to. Relieving pain and discomfort Wear a good support bra if your breasts are sore. Take warm water baths (sitz baths) to soothe pain or discomfort caused by hemorrhoids. Use hemorrhoid cream if your doctor approves. Rest with your legs raised (elevated) if you have leg cramps or low back pain. If you develop bulging veins in your legs: Wear support hose as told by your doctor. Raise your feet for 15 minutes, 3-4 times a day. Limit salt in your food. Safety Wear your seat belt at all times when you are in a car. Talk with your doctor if someone is hurting you or yelling at you a lot. Lifestyle Do not use hot tubs, steam rooms, or saunas. Do not douche. Do not use tampons or scented sanitary pads. Avoid cat litter boxes and soil used by cats. These carry germs that can harm your baby and can cause   a loss of your baby by miscarriage or stillbirth. Do not use herbal medicines, illegal drugs, or medicines that are not approved by your doctor. Do not drink alcohol. Do not smoke or use any products that contain nicotine or tobacco. If you need help quitting, ask your doctor. General instructions Keep all follow-up visits. This is important. Ask your doctor about local prenatal classes. Ask your doctor about the right foods to eat or for help finding a  counselor. Where to find more information American Pregnancy Association: americanpregnancy.org American College of Obstetricians and Gynecologists: www.acog.org Office on Women's Health: womenshealth.gov/pregnancy Contact a doctor if: You have a headache that does not go away when you take medicine. You have changes in how you see, or you see spots in front of your eyes. You have mild cramps, pressure, or pain in your lower belly. You continue to feel like you may vomit (nauseous), you vomit, or you have watery poop (diarrhea). You have bad-smelling fluid coming from your vagina. You have pain when you pee or your pee smells bad. You have very bad swelling of your face, hands, ankles, feet, or legs. You have a fever. Get help right away if: You are leaking fluid from your vagina. You have spotting or bleeding from your vagina. You have very bad belly cramping or pain. You have trouble breathing. You have chest pain. You faint. You have not felt your baby move for the time period told by your doctor. You have new or increased pain, swelling, or redness in an arm or leg. Summary The second trimester of pregnancy is from week 13 through week 27 (months 4 through 6). Eat healthy meals. Exercise as told by your doctor. Most people can do their usual exercise during pregnancy. Do not use herbal medicines, illegal drugs, or medicines that are not approved by your doctor. Do not drink alcohol. Call your doctor if you get sick or if you notice anything unusual about your pregnancy. This information is not intended to replace advice given to you by your health care provider. Make sure you discuss any questions you have with your healthcare provider. Document Revised: 09/29/2019 Document Reviewed: 08/05/2019 Elsevier Patient Education  2022 [employer]. Common Medications Safe in Pregnancy  Acne:      Constipation:  Benzoyl Peroxide     Colace  Clindamycin      Dulcolax  Suppository  Topica Erythromycin     Fibercon  Salicylic Acid      Metamucil         Miralax AVOID:        Senakot   Accutane    Cough:  Retin-A       Cough Drops  Tetracycline      Phenergan w/ Codeine if Rx  Minocycline      Robitussin (Plain & DM)  Antibiotics:     Crabs/Lice:  Ceclor       RID  Cephalosporins    AVOID:  E-Mycins      Kwell  Keflex  Macrobid/Macrodantin   Diarrhea:  Penicillin      Kao-Pectate  Zithromax      Imodium AD         PUSH FLUIDS AVOID:       Cipro     Fever:  Tetracycline      Tylenol (Regular or Extra  Minocycline       Strength)  Levaquin      Extra Strength-Do not            Exceed 8 tabs/24 hrs Caffeine:        <200mg/day (equiv. To 1 cup of coffee or  approx. 3 12 oz sodas)         Gas: Cold/Hayfever:       Gas-X  Benadryl      Mylicon  Claritin       Phazyme  **Claritin-D        Chlor-Trimeton    Headaches:  Dimetapp      ASA-Free Excedrin  Drixoral-Non-Drowsy     Cold Compress  Mucinex (Guaifenasin)     Tylenol (Regular or Extra  Sudafed/Sudafed-12 Hour     Strength)  **Sudafed PE Pseudoephedrine   Tylenol Cold & Sinus     Vicks Vapor Rub  Zyrtec  **AVOID if Problems With Blood Pressure         Heartburn: Avoid lying down for at least 1 hour after meals  Aciphex      Maalox     Rash:  Milk of Magnesia     Benadryl    Mylanta       1% Hydrocortisone Cream  Pepcid  Pepcid Complete   Sleep Aids:  Prevacid      Ambien   Prilosec       Benadryl  Rolaids       Chamomile Tea  Tums (Limit 4/day)     Unisom         Tylenol PM         Warm milk-add vanilla or  Hemorrhoids:       Sugar for taste  Anusol/Anusol H.C.  (RX: Analapram 2.5%)  Sugar Substitutes:  Hydrocortisone OTC     Ok in moderation  Preparation H      Tucks        Vaseline lotion applied to tissue with wiping    Herpes:     Throat:  Acyclovir      Oragel  Famvir  Valtrex     Vaccines:         Flu Shot Leg  Cramps:       *Gardasil  Benadryl      Hepatitis A         Hepatitis B Nasal Spray:       Pneumovax  Saline Nasal Spray     Polio Booster         Tetanus Nausea:       Tuberculosis test or PPD  Vitamin B6 25 mg TID   AVOID:    Dramamine      *Gardasil  Emetrol       Live Poliovirus  Ginger Root 250 mg QID    MMR (measles, mumps &  High Complex Carbs @ Bedtime    rebella)  Sea Bands-Accupressure    Varicella (Chickenpox)  Unisom 1/2 tab TID     *No known complications           If received before Pain:         Known pregnancy;   Darvocet       Resume series after  Lortab        Delivery  Percocet    Yeast:   Tramadol      Femstat  Tylenol 3      Gyne-lotrimin  Ultram       Monistat  Vicodin           MISC:         All Sunscreens             Hair Coloring/highlights          Insect Repellant's          (Including DEET)         Mystic Tans  

## 2020-12-12 ENCOUNTER — Ambulatory Visit (INDEPENDENT_AMBULATORY_CARE_PROVIDER_SITE_OTHER): Payer: Medicaid Other | Admitting: Obstetrics and Gynecology

## 2020-12-12 ENCOUNTER — Other Ambulatory Visit (HOSPITAL_COMMUNITY)
Admission: RE | Admit: 2020-12-12 | Discharge: 2020-12-12 | Disposition: A | Discharge status: Home / Self Care | Payer: Medicaid Other | Source: Ambulatory Visit | Attending: Obstetrics and Gynecology | Admitting: Obstetrics and Gynecology

## 2020-12-12 ENCOUNTER — Other Ambulatory Visit: Payer: Self-pay

## 2020-12-12 ENCOUNTER — Encounter: Payer: Self-pay | Admitting: Obstetrics and Gynecology

## 2020-12-12 VITALS — BP 123/80 | HR 93 | Wt 196.4 lb

## 2020-12-12 DIAGNOSIS — Z3482 Encounter for supervision of other normal pregnancy, second trimester: Secondary | ICD-10-CM

## 2020-12-12 DIAGNOSIS — Z3A20 20 weeks gestation of pregnancy: Secondary | ICD-10-CM | POA: Insufficient documentation

## 2020-12-12 DIAGNOSIS — Z124 Encounter for screening for malignant neoplasm of cervix: Secondary | ICD-10-CM | POA: Insufficient documentation

## 2020-12-12 LAB — POCT URINALYSIS DIPSTICK OB
Bilirubin, UA: NEGATIVE
Blood, UA: NEGATIVE
Glucose, UA: NEGATIVE
Ketones, UA: NEGATIVE
Leukocytes, UA: NEGATIVE
Nitrite, UA: NEGATIVE
POC,PROTEIN,UA: NEGATIVE
Spec Grav, UA: 1.01 (ref 1.010–1.025)
Urobilinogen, UA: 0.2 E.U./dL
pH, UA: 8 (ref 5.0–8.0)

## 2020-12-12 NOTE — Progress Notes (Signed)
NOB: Patient states that she is doing a little better-did not go to [facility] because she "heard bad things".  Encouraged to seek counseling.  Rose saw her today and also discussed this and gave her some additional resources.  Patient also concerned with sleep apnea.  She says she woke up gasping 1 night and wants to be tested.  Ultrasound incomplete for heart and spine-reordered.  Pap performed  Physical examination General NAD, Conversant  HEENT Atraumatic; Op clear with mmm.  Normo-cephalic. Pupils reactive. Anicteric sclerae  Thyroid/Neck Smooth without nodularity or enlargement. Normal ROM.  Neck Supple.  Skin No rashes, lesions or ulceration. Normal palpated skin turgor. No nodularity.  Breasts: No masses or discharge.  Symmetric.  No axillary adenopathy.  Lungs: Clear to auscultation.No rales or wheezes. Normal Respiratory effort, no retractions.  Heart: NSR.  No murmurs or rubs appreciated. No periferal edema  Abdomen: Soft.  Non-tender.  No masses.  No HSM. No hernia  Extremities: Moves all appropriately.  Normal ROM for age. No lymphadenopathy.  Neuro: Oriented to PPT.  Normal mood. Normal affect.     Pelvic:   Vulva: Normal appearance.  No lesions.  Vagina: No lesions or abnormalities noted.  Support: Normal pelvic support.  Urethra No masses tenderness or scarring.  Meatus Normal size without lesions or prolapse.  Cervix: Normal appearance.  No lesions.  Anus: Normal exam.  No lesions.  Perineum: Normal exam.  No lesions.        Bimanual   Adnexae: No masses.  Non-tender to palpation.  Uterus: Enlarged.  Fetal heart tones noted non-tender.  Mobile.  AV.  Adnexae: No masses.  Non-tender to palpation.  Cul-de-sac: Negative for abnormality.  Adnexae: No masses.  Non-tender to palpation.         Pelvimetry   Diagonal: Reached.  Spines: Average.  Sacrum: Concave.  Pubic Arch: Normal.

## 2020-12-12 NOTE — Progress Notes (Signed)
NOB-PE for initial prenatal care. Pt stated that she was having anal pain when the baby kicks her. Pt unsure of last pap smear maybe 2-3.

## 2020-12-14 ENCOUNTER — Other Ambulatory Visit: Payer: Self-pay | Admitting: Obstetrics and Gynecology

## 2020-12-14 ENCOUNTER — Other Ambulatory Visit: Payer: Self-pay

## 2020-12-14 DIAGNOSIS — Z362 Encounter for other antenatal screening follow-up: Secondary | ICD-10-CM

## 2020-12-14 DIAGNOSIS — Z3A20 20 weeks gestation of pregnancy: Secondary | ICD-10-CM

## 2020-12-15 LAB — CYTOLOGY - PAP
Comment: NEGATIVE
Diagnosis: NEGATIVE
High risk HPV: NEGATIVE

## 2021-01-05 ENCOUNTER — Ambulatory Visit
Admission: RE | Admit: 2021-01-05 | Discharge: 2021-01-05 | Disposition: A | Discharge status: Home / Self Care | Payer: Medicaid Other | Source: Ambulatory Visit | Attending: Obstetrics and Gynecology | Admitting: Obstetrics and Gynecology

## 2021-01-05 ENCOUNTER — Other Ambulatory Visit: Payer: Self-pay

## 2021-01-05 DIAGNOSIS — Z3A25 25 weeks gestation of pregnancy: Secondary | ICD-10-CM | POA: Insufficient documentation

## 2021-01-05 DIAGNOSIS — Z362 Encounter for other antenatal screening follow-up: Secondary | ICD-10-CM | POA: Diagnosis not present

## 2021-01-05 DIAGNOSIS — Z3A20 20 weeks gestation of pregnancy: Secondary | ICD-10-CM | POA: Diagnosis present

## 2021-01-08 ENCOUNTER — Other Ambulatory Visit: Payer: Self-pay

## 2021-01-08 ENCOUNTER — Observation Stay
Admission: EM | Admit: 2021-01-08 | Discharge: 2021-01-08 | Disposition: A | Discharge status: Home / Self Care | Payer: Medicaid Other | Attending: Obstetrics and Gynecology | Admitting: Obstetrics and Gynecology

## 2021-01-08 ENCOUNTER — Encounter: Payer: Self-pay | Admitting: Obstetrics and Gynecology

## 2021-01-08 DIAGNOSIS — U071 COVID-19: Secondary | ICD-10-CM | POA: Diagnosis present

## 2021-01-08 DIAGNOSIS — O98512 Other viral diseases complicating pregnancy, second trimester: Secondary | ICD-10-CM

## 2021-01-08 DIAGNOSIS — Z3A25 25 weeks gestation of pregnancy: Secondary | ICD-10-CM | POA: Diagnosis not present

## 2021-01-08 DIAGNOSIS — O368120 Decreased fetal movements, second trimester, not applicable or unspecified: Secondary | ICD-10-CM | POA: Diagnosis not present

## 2021-01-08 DIAGNOSIS — Z8616 Personal history of COVID-19: Secondary | ICD-10-CM | POA: Insufficient documentation

## 2021-01-08 HISTORY — DX: Depression, unspecified: F32.A

## 2021-01-08 LAB — URINALYSIS, COMPLETE (UACMP) WITH MICROSCOPIC
Bacteria, UA: NONE SEEN
Bilirubin Urine: NEGATIVE
Glucose, UA: NEGATIVE mg/dL
Ketones, ur: NEGATIVE mg/dL
Leukocytes,Ua: NEGATIVE
Nitrite: NEGATIVE
Protein, ur: NEGATIVE mg/dL
Specific Gravity, Urine: 1.02 (ref 1.005–1.030)
pH: 6.5 (ref 5.0–8.0)

## 2021-01-08 MED ORDER — ONDANSETRON 4 MG PO TBDP: 4.0000 mg | ORAL_TABLET | Freq: Four times a day (QID) | ORAL | Status: DC | PRN

## 2021-01-08 NOTE — ED Triage Notes (Signed)
C/o reduced fetal movement, and fever. Pt. States she has persistent fever, body aches, vomiting x2 days. Pt. Tested positive for covid yesterday. 500mg  tylenol at 1800. Pt. Is [redacted]weeks pregnant.

## 2021-01-08 NOTE — Discharge Planning (Signed)
Discharge instructions provided to patient. Patient verbalized understanding. Red flag signs reviewed by RN. Patient discharged home in stable condition.

## 2021-01-08 NOTE — OB Triage Note (Signed)
Patient is a 35 yo, G2P0, at 25 weeks 2 days. Patient presents with complaints of positive Covid test, body aches, fever, and fatuige. Pt reports +FM however states it is less than usual. Pt denies any vaginal bleeding, LOF, or contractions. Monitors applied and assessing. VSS. Initial fetal heart tone 170. Will continue to monitor.

## 2021-01-09 ENCOUNTER — Encounter: Payer: Self-pay | Admitting: Obstetrics and Gynecology

## 2021-01-09 ENCOUNTER — Telehealth (INDEPENDENT_AMBULATORY_CARE_PROVIDER_SITE_OTHER): Payer: Medicaid Other | Admitting: Obstetrics and Gynecology

## 2021-01-09 DIAGNOSIS — Z3A25 25 weeks gestation of pregnancy: Secondary | ICD-10-CM | POA: Diagnosis not present

## 2021-01-09 DIAGNOSIS — O98512 Other viral diseases complicating pregnancy, second trimester: Secondary | ICD-10-CM

## 2021-01-09 DIAGNOSIS — O368120 Decreased fetal movements, second trimester, not applicable or unspecified: Secondary | ICD-10-CM

## 2021-01-09 DIAGNOSIS — U071 COVID-19: Secondary | ICD-10-CM

## 2021-01-09 NOTE — Progress Notes (Signed)
Virtual Visit via Video Note  I connected with Vickie Carlson on 01/09/21 at  1:30 PM EDT by a video enabled telemedicine application and verified that I am speaking with the correct person using two identifiers.  Location: Patient: Home Provider: Office   I discussed the limitations of evaluation and management by telemedicine and the availability of in person appointments. The patient expressed understanding and agreed to proceed.  History of Present Illness: Vickie Carlson is a 35 y.o. [id number] female at [redacted]w[redacted]d gestation who presents for video visit due to recent COVID infection. Was diagnosed approximately 4 days ago.  She was seen in triage last night due to complaints of decreased fetal movement, had a reactive NST.  Notes that nausea/vomiting have finally resolved and has had no more fevers since yesterday. Still has cough. Taking Tylenol for body aches.     Observations/Objective: Last menstrual period 07/14/2020.  Height 5'2.  Gen App: NAD, appears fatigued.  Assessment and Plan:  COVID infection in pregnancy - continue to advise OTC remedies for COVID symptoms.  Pregnancy at 25 weeks - return in 3 weeks for routine OB visit, for glucose test at that time.   Follow Up Instructions:  I discussed the assessment and treatment plan with the patient. The patient was provided an opportunity to ask questions and all were answered. The patient agreed with the plan and demonstrated an understanding of the instructions.   The patient was advised to call back or seek an in-person evaluation if the symptoms worsen or if the condition fails to improve as anticipated.  I provided 8 minutes of non-face-to-face time during this encounter.   Hildred Laser, MD [facility]

## 2021-01-09 NOTE — Patient Instructions (Signed)
Common Medications Safe in Pregnancy  Acne:      Constipation:  Benzoyl Peroxide     Colace  Clindamycin      Dulcolax Suppository  Topica Erythromycin     Fibercon  Salicylic Acid      Metamucil         Miralax AVOID:        Senakot   Accutane    Cough:  Retin-A       Cough Drops  Tetracycline      Phenergan w/ Codeine if Rx  Minocycline      Robitussin (Plain & DM)  Antibiotics:     Crabs/Lice:  Ceclor       RID  Cephalosporins    AVOID:  E-Mycins      Kwell  Keflex  Macrobid/Macrodantin   Diarrhea:  Penicillin      Kao-Pectate  Zithromax      Imodium AD         PUSH FLUIDS AVOID:       Cipro     Fever:  Tetracycline      Tylenol (Regular or Extra  Minocycline       Strength)  Levaquin      Extra Strength-Do not          Exceed 8 tabs/24 hrs Caffeine:        <200mg/day (equiv. To 1 cup of coffee or  approx. 3 12 oz sodas)         Gas: Cold/Hayfever:       Gas-X  Benadryl      Mylicon  Claritin       Phazyme  **Claritin-D        Chlor-Trimeton    Headaches:  Dimetapp      ASA-Free Excedrin  Drixoral-Non-Drowsy     Cold Compress  Mucinex (Guaifenasin)     Tylenol (Regular or Extra  Sudafed/Sudafed-12 Hour     Strength)  **Sudafed PE Pseudoephedrine   Tylenol Cold & Sinus     Vicks Vapor Rub  Zyrtec  **AVOID if Problems With Blood Pressure         Heartburn: Avoid lying down for at least 1 hour after meals  Aciphex      Maalox     Rash:  Milk of Magnesia     Benadryl    Mylanta       1% Hydrocortisone Cream  Pepcid  Pepcid Complete   Sleep Aids:  Prevacid      Ambien   Prilosec       Benadryl  Rolaids       Chamomile Tea  Tums (Limit 4/day)     Unisom         Tylenol PM         Warm milk-add vanilla or  Hemorrhoids:       Sugar for taste  Anusol/Anusol H.C.  (RX: Analapram 2.5%)  Sugar Substitutes:  Hydrocortisone OTC     Ok in moderation  Preparation H      Tucks        Vaseline lotion applied to tissue with  wiping    Herpes:     Throat:  Acyclovir      Oragel  Famvir  Valtrex     Vaccines:         Flu Shot Leg Cramps:       *Gardasil  Benadryl      Hepatitis A         Hepatitis B Nasal Spray:         Pneumovax  Saline Nasal Spray     Polio Booster         Tetanus Nausea:       Tuberculosis test or PPD  Vitamin B6 25 mg TID   AVOID:    Dramamine      *Gardasil  Emetrol       Live Poliovirus  Ginger Root 250 mg QID    MMR (measles, mumps &  High Complex Carbs @ Bedtime    rebella)  Sea Bands-Accupressure    Varicella (Chickenpox)  Unisom 1/2 tab TID     *No known complications           If received before Pain:         Known pregnancy;   Darvocet       Resume series after  Lortab        Delivery  Percocet    Yeast:   Tramadol      Femstat  Tylenol 3      Gyne-lotrimin  Ultram       Monistat  Vicodin           MISC:         All Sunscreens           Hair Coloring/highlights          Insect Repellant's          (Including DEET)         Mystic Tans    COVID-19: What to Do if You Are Sick If you test positive and are an older adult or someone who is at high risk of getting very sick from COVID-19, treatment may be available. Contact a healthcare provider right away after a positive test to determine if you are eligible, even if your symptoms are mild right now. You can also visit a Test to Treat location and, if eligible, receive a prescription from a provider. Don't delay: Treatment must be started within the first few days to be effective. If you have a fever, cough, or other symptoms, you might have COVID-19. Most people have mild illness and are able to recover at home. If you are sick: Keep track of your symptoms. If you have an emergency warning sign (including trouble breathing), call 911. Steps to help prevent the spread of COVID-19 if you are sick If you are sick with COVID-19 or think you might have COVID-19, follow the steps below to care for yourself and to help protect  other people in your home and community. Stay home except to get medical care Stay home. Most people with COVID-19 have mild illness and can recover at home without medical care. Do not leave your home, except to get medical care. Do not visit public areas and do not go to places where you are unable to wear a mask. Take care of yourself. Get rest and stay hydrated. Take over-the-counter medicines, such as acetaminophen, to help you feel better. Stay in touch with your doctor. Call before you get medical care. Be sure to get care if you have trouble breathing, or have any other emergency warning signs, or if you think it is an emergency. Avoid public transportation, ride-sharing, or taxis if possible. Get tested If you have symptoms of COVID-19, get tested. While waiting for test results, stay away from others, including staying apart from those living in your household. Get tested as soon as possible after your symptoms start. Treatments may be available for people with COVID-19 who are at risk  for becoming very sick. Don't delay: Treatment must be started early to be effective--some treatments must begin within 5 days of your first symptoms. Contact your healthcare provider right away if your test result is positive to determine if you are eligible. Self-tests are one of several options for testing for the virus that causes COVID-19 and may be more convenient than laboratory-based tests and point-of-care tests. Ask your healthcare provider or your local health department if you need help interpreting your test results. You can visit your state, tribal, local, and territorial health department's website to look for the latest local information on testing sites. Separate yourself from other people As much as possible, stay in a specific room and away from other people and pets in your home. If possible, you should use a separate bathroom. If you need to be around other people or animals in or outside of  the home, wear a well-fitting mask. Tell your close contacts that they may have been exposed to COVID-19. An infected person can spread COVID-19 starting 48 hours (or 2 days) before the person has any symptoms or tests positive. By letting your close contacts know they may have been exposed to COVID-19, you are helping to protect everyone. See COVID-19 and Animals if you have questions about pets. If you are diagnosed with COVID-19, someone from the health department may call you. Answer the call to slow the spread. Monitor your symptoms Symptoms of COVID-19 include fever, cough, or other symptoms. Follow care instructions from your healthcare provider and local health department. Your local health authorities may give instructions on checking your symptoms and reporting information. When to seek emergency medical attention Look for emergency warning signs* for COVID-19. If someone is showing any of these signs, seek emergency medical care immediately: Trouble breathing Persistent pain or pressure in the chest New confusion Inability to wake or stay awake Pale, gray, or blue-colored skin, lips, or nail beds, depending on skin tone *This list is not all possible symptoms. Please call your medical provider for any other symptoms that are severe or concerning to you. Call 911 or call ahead to your local emergency facility: Notify the operator that you are seeking care for someone who has or may have COVID-19. Call ahead before visiting your doctor Call ahead. Many medical visits for routine care are being postponed or done by phone or telemedicine. If you have a medical appointment that cannot be postponed, call your doctor's office, and tell them you have or may have COVID-19. This will help the office protect themselves and other patients. If you are sick, wear a well-fitting mask You should wear a mask if you must be around other people or animals, including pets (even at home). Wear a mask with  the best fit, protection, and comfort for you. You don't need to wear the mask if you are alone. If you can't put on a mask (because of trouble breathing, for example), cover your coughs and sneezes in some other way. Try to stay at least 6 feet away from other people. This will help protect the people around you. Masks should not be placed on young children under age 57 years, anyone who has trouble breathing, or anyone who is not able to remove the mask without help. Cover your coughs and sneezes Cover your mouth and nose with a tissue when you cough or sneeze. Throw away used tissues in a lined trash can. Immediately wash your hands with soap and water for at least 20  seconds. If soap and water are not available, clean your hands with an alcohol-based hand sanitizer that contains at least 60% alcohol. Clean your hands often Wash your hands often with soap and water for at least 20 seconds. This is especially important after blowing your nose, coughing, or sneezing; going to the bathroom; and before eating or preparing food. Use hand sanitizer if soap and water are not available. Use an alcohol-based hand sanitizer with at least 60% alcohol, covering all surfaces of your hands and rubbing them together until they feel dry. Soap and water are the best option, especially if hands are visibly dirty. Avoid touching your eyes, nose, and mouth with unwashed hands. Handwashing Tips Avoid sharing personal household items Do not share dishes, drinking glasses, cups, eating utensils, towels, or bedding with other people in your home. Wash these items thoroughly after using them with soap and water or put in the dishwasher. Clean surfaces in your home regularly Clean and disinfect high-touch surfaces (for example, doorknobs, tables, handles, light switches, and countertops) in your "sick room" and bathroom. In shared spaces, you should clean and disinfect surfaces and items after each use by the person who is  ill. If you are sick and cannot clean, a caregiver or other person should only clean and disinfect the area around you (such as your bedroom and bathroom) on an as needed basis. Your caregiver/other person should wait as long as possible (at least several hours) and wear a mask before entering, cleaning, and disinfecting shared spaces that you use. Clean and disinfect areas that may have blood, stool, or body fluids on them. Use household cleaners and disinfectants. Clean visible dirty surfaces with household cleaners containing soap or detergent. Then, use a household disinfectant. Use a product from [employer] List N: Disinfectants for Coronavirus (QWQJI-17). Be sure to follow the instructions on the label to ensure safe and effective use of the product. Many products recommend keeping the surface wet with a disinfectant for a certain period of time (look at "contact time" on the product label). You may also need to wear personal protective equipment, such as gloves, depending on the directions on the product label. Immediately after disinfecting, wash your hands with soap and water for 20 seconds. For completed guidance on cleaning and disinfecting your home, visit Complete Disinfection Guidance. Take steps to improve ventilation at home Improve ventilation (air flow) at home to help prevent from spreading COVID-19 to other people in your household. Clear out COVID-19 virus particles in the air by opening windows, using air filters, and turning on fans in your home. Use this interactive tool to learn how to improve air flow in your home. When you can be around others after being sick with COVID-19 Deciding when you can be around others is different for different situations. Find out when you can safely end home isolation. For any additional questions about your care, contact your healthcare provider or state or local health department. 07/25/2020 Content source: [facility] for Immunization and  Respiratory Diseases ([facility]), Division of Viral Diseases This information is not intended to replace advice given to you by your health care provider. Make sure you discuss any questions you have with your health care provider. Document Revised: 09/07/2020 Document Reviewed: 09/07/2020 Elsevier Patient Education  [place].

## 2021-01-09 NOTE — Final Progress Note (Signed)
L&D OB Triage Note  Vickie Carlson is a 35 y.o. G2P0010 female at [redacted]w[redacted]d, EDD Estimated Date of Delivery: 04/21/21 who presented to triage for complaints of decreased fetal movement today.  She was recently diagnosed with COVID-19 infection 2 days ago. Has been having body aches, fever, and fatigue. Also reported some nausea/vomiting, but has not had any further episodes since lunchtime.  She was evaluated by the nurses with no significant findings. Vital signs stable. An NST was performed and has been reviewed by MD.   NST INTERPRETATION: Indications: decreased fetal movement  Mode: External Baseline Rate (A): 155 bpm Variability: Moderate Accelerations: 10 x 10 Decelerations: Variable     Contraction Frequency (min): one noted  Impression: reactive   Plan: NST performed was reviewed and was found to be reactive. She was discharged home with education on safe OTC meds to utilize during pregnancy, warning signs for when further evaluation was needed. Also encouraged aggressive hydration as tolerated.  Continue routine prenatal care. Follow up with OB/GYN as previously scheduled.     Hildred Laser, MD [facility]

## 2021-02-07 ENCOUNTER — Other Ambulatory Visit: Payer: Self-pay

## 2021-02-07 DIAGNOSIS — Z3483 Encounter for supervision of other normal pregnancy, third trimester: Secondary | ICD-10-CM

## 2021-02-07 DIAGNOSIS — Z3A29 29 weeks gestation of pregnancy: Secondary | ICD-10-CM

## 2021-02-08 ENCOUNTER — Other Ambulatory Visit: Payer: Medicaid Other

## 2021-02-08 ENCOUNTER — Encounter: Payer: Self-pay | Admitting: Obstetrics and Gynecology

## 2021-02-08 ENCOUNTER — Ambulatory Visit (INDEPENDENT_AMBULATORY_CARE_PROVIDER_SITE_OTHER): Payer: Medicaid Other | Admitting: Obstetrics and Gynecology

## 2021-02-08 ENCOUNTER — Other Ambulatory Visit: Payer: Self-pay

## 2021-02-08 VITALS — BP 133/86 | HR 83

## 2021-02-08 DIAGNOSIS — Z3A29 29 weeks gestation of pregnancy: Secondary | ICD-10-CM

## 2021-02-08 DIAGNOSIS — Z658 Other specified problems related to psychosocial circumstances: Secondary | ICD-10-CM

## 2021-02-08 DIAGNOSIS — Z3483 Encounter for supervision of other normal pregnancy, third trimester: Secondary | ICD-10-CM | POA: Diagnosis not present

## 2021-02-08 DIAGNOSIS — Z23 Encounter for immunization: Secondary | ICD-10-CM | POA: Diagnosis not present

## 2021-02-08 DIAGNOSIS — F32A Depression, unspecified: Secondary | ICD-10-CM

## 2021-02-08 DIAGNOSIS — O99340 Other mental disorders complicating pregnancy, unspecified trimester: Secondary | ICD-10-CM

## 2021-02-08 LAB — POCT URINALYSIS DIPSTICK OB
Bilirubin, UA: NEGATIVE
Glucose, UA: NEGATIVE
Ketones, UA: NEGATIVE
Leukocytes, UA: NEGATIVE
Nitrite, UA: NEGATIVE
POC,PROTEIN,UA: NEGATIVE
Spec Grav, UA: 1.015 (ref 1.010–1.025)
Urobilinogen, UA: 0.2 E.U./dL
pH, UA: 6.5 (ref 5.0–8.0)

## 2021-02-08 NOTE — Progress Notes (Signed)
ROB: She is doing well today. She signed BTC form. Tdap done. Flu done. Medicaid form completed and faxed.
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
## 2021-02-09 LAB — CBC
Hematocrit: 36.1 % (ref 34.0–46.6)
Hemoglobin: 12.3 g/dL (ref 11.1–15.9)
MCH: 30.4 pg (ref 26.6–33.0)
MCHC: 34.1 g/dL (ref 31.5–35.7)
MCV: 89 fL (ref 79–97)
Platelets: 298 10*3/uL (ref 150–450)
RBC: 4.05 x10E6/uL (ref 3.77–5.28)
RDW: 11.9 % (ref 11.7–15.4)
WBC: 13.9 10*3/uL — ABNORMAL HIGH (ref 3.4–10.8)

## 2021-02-09 LAB — RPR: RPR Ser Ql: NONREACTIVE

## 2021-02-09 LAB — GLUCOSE, 1 HOUR GESTATIONAL: Gestational Diabetes Screen: 106 mg/dL (ref 65–139)

## 2021-02-19 ENCOUNTER — Ambulatory Visit (INDEPENDENT_AMBULATORY_CARE_PROVIDER_SITE_OTHER): Payer: Medicaid Other

## 2021-02-19 ENCOUNTER — Other Ambulatory Visit: Payer: Self-pay

## 2021-02-19 DIAGNOSIS — F32A Depression, unspecified: Secondary | ICD-10-CM | POA: Diagnosis not present

## 2021-02-19 DIAGNOSIS — O99340 Other mental disorders complicating pregnancy, unspecified trimester: Secondary | ICD-10-CM

## 2021-02-21 ENCOUNTER — Ambulatory Visit (INDEPENDENT_AMBULATORY_CARE_PROVIDER_SITE_OTHER): Payer: Medicaid Other | Admitting: Obstetrics and Gynecology

## 2021-02-21 ENCOUNTER — Other Ambulatory Visit: Payer: Medicaid Other

## 2021-02-21 ENCOUNTER — Encounter: Payer: Self-pay | Admitting: Obstetrics and Gynecology

## 2021-02-21 ENCOUNTER — Other Ambulatory Visit: Payer: Self-pay

## 2021-02-21 VITALS — BP 132/91 | HR 99 | Wt 203.9 lb

## 2021-02-21 DIAGNOSIS — Z3A31 31 weeks gestation of pregnancy: Secondary | ICD-10-CM

## 2021-02-21 DIAGNOSIS — Z3483 Encounter for supervision of other normal pregnancy, third trimester: Secondary | ICD-10-CM

## 2021-02-21 LAB — POCT URINALYSIS DIPSTICK OB
Bilirubin, UA: NEGATIVE
Glucose, UA: NEGATIVE
Leukocytes, UA: NEGATIVE
Nitrite, UA: NEGATIVE
Spec Grav, UA: 1.025 (ref 1.010–1.025)
Urobilinogen, UA: 0.2 E.U./dL
pH, UA: 6 (ref 5.0–8.0)

## 2021-02-21 NOTE — Progress Notes (Signed)
ROB: Patient still commuting from [place].  Home life not improved but she is speaking with her family about options.  She has not yet called any numbers that Rose gave her but with some encouragement I believe she will call now.  Reports daily fetal movement.  Complains of heartburn-Tums not working.  Prilosec OTC discussed.  Ultrasound scheduled

## 2021-02-21 NOTE — Progress Notes (Signed)
ROB: She has increased heartburn, Tum is not relieving her symptoms.

## 2021-03-08 ENCOUNTER — Encounter: Payer: Self-pay | Admitting: Obstetrics and Gynecology

## 2021-03-08 ENCOUNTER — Ambulatory Visit (INDEPENDENT_AMBULATORY_CARE_PROVIDER_SITE_OTHER): Payer: Medicaid Other | Admitting: Obstetrics and Gynecology

## 2021-03-08 ENCOUNTER — Other Ambulatory Visit: Payer: Self-pay

## 2021-03-08 VITALS — BP 132/88 | HR 82 | Wt 206.1 lb

## 2021-03-08 DIAGNOSIS — O0993 Supervision of high risk pregnancy, unspecified, third trimester: Secondary | ICD-10-CM

## 2021-03-08 DIAGNOSIS — Z658 Other specified problems related to psychosocial circumstances: Secondary | ICD-10-CM

## 2021-03-08 DIAGNOSIS — R638 Other symptoms and signs concerning food and fluid intake: Secondary | ICD-10-CM

## 2021-03-08 DIAGNOSIS — Z3A33 33 weeks gestation of pregnancy: Secondary | ICD-10-CM

## 2021-03-08 LAB — POCT URINALYSIS DIPSTICK OB
Bilirubin, UA: NEGATIVE
Blood, UA: NEGATIVE
Glucose, UA: NEGATIVE
Ketones, UA: NEGATIVE
Leukocytes, UA: NEGATIVE
Nitrite, UA: NEGATIVE
Spec Grav, UA: 1.025 (ref 1.010–1.025)
Urobilinogen, UA: 0.2 E.U./dL
pH, UA: 6.5 (ref 5.0–8.0)

## 2021-03-08 NOTE — Patient Instructions (Signed)

## 2021-03-08 NOTE — Progress Notes (Signed)
OB-Pt present for routine prenatal care. Pt stated that she was doing well no problems.  
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
## 2021-03-09 ENCOUNTER — Encounter: Payer: Medicaid Other | Admitting: Obstetrics and Gynecology

## 2021-03-13 ENCOUNTER — Other Ambulatory Visit: Payer: Medicaid Other

## 2021-03-22 ENCOUNTER — Other Ambulatory Visit: Payer: Self-pay

## 2021-03-22 ENCOUNTER — Encounter: Payer: Self-pay | Admitting: Obstetrics and Gynecology

## 2021-03-22 ENCOUNTER — Ambulatory Visit (INDEPENDENT_AMBULATORY_CARE_PROVIDER_SITE_OTHER): Payer: Medicaid Other | Admitting: Obstetrics and Gynecology

## 2021-03-22 VITALS — BP 151/93 | HR 90 | Wt 220.6 lb

## 2021-03-22 DIAGNOSIS — Z3483 Encounter for supervision of other normal pregnancy, third trimester: Secondary | ICD-10-CM

## 2021-03-22 DIAGNOSIS — Z3A35 35 weeks gestation of pregnancy: Secondary | ICD-10-CM

## 2021-03-22 DIAGNOSIS — O133 Gestational [pregnancy-induced] hypertension without significant proteinuria, third trimester: Secondary | ICD-10-CM | POA: Diagnosis not present

## 2021-03-22 LAB — POCT URINALYSIS DIPSTICK OB
Bilirubin, UA: NEGATIVE
Glucose, UA: NEGATIVE
Ketones, UA: NEGATIVE
Leukocytes, UA: NEGATIVE
Nitrite, UA: NEGATIVE
Spec Grav, UA: 1.025 (ref 1.010–1.025)
Urobilinogen, UA: 0.2 E.U./dL
pH, UA: 6.5 (ref 5.0–8.0)

## 2021-03-22 NOTE — Progress Notes (Signed)
ROB: Patient states that other than swelling in her legs she feels well.  Denies epigastric pain, headache, floaters etc.  Repeat blood pressure noted at 143/87.  Preeclamptic lab work ordered.  Signs and symptoms of preeclampsia discussed red flag warning signs given and patient instructed to call if she has any of these.  Patient has close follow-up early next week.

## 2021-03-22 NOTE — Progress Notes (Signed)
ROB: She is doing well, no new concerns. 

## 2021-03-22 NOTE — Addendum Note (Signed)
Addended by: Tommie Raymond on: 03/22/2021 04:12 PM   Modules accepted: Orders

## 2021-03-23 ENCOUNTER — Encounter: Payer: Medicaid Other | Admitting: Obstetrics and Gynecology

## 2021-03-23 ENCOUNTER — Telehealth: Payer: Self-pay | Admitting: Obstetrics and Gynecology

## 2021-03-23 LAB — COMPREHENSIVE METABOLIC PANEL
ALT: 6 IU/L (ref 0–32)
AST: 11 IU/L (ref 0–40)
Albumin/Globulin Ratio: 1.3 (ref 1.2–2.2)
Albumin: 3.1 g/dL — ABNORMAL LOW (ref 3.8–4.8)
Alkaline Phosphatase: 155 IU/L — ABNORMAL HIGH (ref 44–121)
BUN/Creatinine Ratio: 13 (ref 9–23)
BUN: 7 mg/dL (ref 6–20)
Bilirubin Total: <0.2 mg/dL (ref 0.0–1.2)
CO2: 16 mmol/L — ABNORMAL LOW (ref 20–29)
Calcium: 8.7 mg/dL (ref 8.7–10.2)
Chloride: 108 mmol/L — ABNORMAL HIGH (ref 96–106)
Creatinine, Ser: 0.52 mg/dL — ABNORMAL LOW (ref 0.57–1.00)
Globulin, Total: 2.3 g/dL (ref 1.5–4.5)
Glucose: 98 mg/dL (ref 70–99)
Potassium: 3.7 mmol/L (ref 3.5–5.2)
Sodium: 139 mmol/L (ref 134–144)
Total Protein: 5.4 g/dL — ABNORMAL LOW (ref 6.0–8.5)
eGFR: 125 mL/min/{1.73_m2} (ref >59)

## 2021-03-23 LAB — CBC WITH DIFFERENTIAL/PLATELET
Basophils Absolute: 0 10*3/uL (ref 0.0–0.2)
Basos: 0 %
EOS (ABSOLUTE): 0.2 10*3/uL (ref 0.0–0.4)
Eos: 2 %
Hematocrit: 34.1 % (ref 34.0–46.6)
Hemoglobin: 11.3 g/dL (ref 11.1–15.9)
Immature Grans (Abs): 0.1 10*3/uL (ref 0.0–0.1)
Immature Granulocytes: 1 %
Lymphocytes Absolute: 3.1 10*3/uL (ref 0.7–3.1)
Lymphs: 22 %
MCH: 28.5 pg (ref 26.6–33.0)
MCHC: 33.1 g/dL (ref 31.5–35.7)
MCV: 86 fL (ref 79–97)
Monocytes Absolute: 0.9 10*3/uL (ref 0.1–0.9)
Monocytes: 7 %
Neutrophils Absolute: 9.9 10*3/uL — ABNORMAL HIGH (ref 1.4–7.0)
Neutrophils: 68 %
Platelets: 336 10*3/uL (ref 150–450)
RBC: 3.96 x10E6/uL (ref 3.77–5.28)
RDW: 12.2 % (ref 11.7–15.4)
WBC: 14.3 10*3/uL — ABNORMAL HIGH (ref 3.4–10.8)

## 2021-03-23 LAB — PROTEIN / CREATININE RATIO, URINE
Creatinine, Urine: 279.9 mg/dL
Protein, Ur: 220.4 mg/dL
Protein/Creat Ratio: 787 mg/g creat — ABNORMAL HIGH (ref 0–200)

## 2021-03-23 NOTE — Telephone Encounter (Signed)
Pt called states that she saw her lab results and was wondering about induction date? Please Advise.

## 2021-03-25 ENCOUNTER — Encounter: Payer: Self-pay | Admitting: Obstetrics and Gynecology

## 2021-03-25 ENCOUNTER — Observation Stay (HOSPITAL_BASED_OUTPATIENT_CLINIC_OR_DEPARTMENT_OTHER)
Admission: EM | Admit: 2021-03-25 | Discharge: 2021-03-25 | Disposition: A | Discharge status: Home / Self Care | Payer: Medicaid Other | Source: Home / Self Care | Admitting: Obstetrics and Gynecology

## 2021-03-25 ENCOUNTER — Other Ambulatory Visit: Payer: Self-pay

## 2021-03-25 DIAGNOSIS — O368130 Decreased fetal movements, third trimester, not applicable or unspecified: Secondary | ICD-10-CM | POA: Insufficient documentation

## 2021-03-25 DIAGNOSIS — O26893 Other specified pregnancy related conditions, third trimester: Secondary | ICD-10-CM | POA: Insufficient documentation

## 2021-03-25 DIAGNOSIS — Z3A36 36 weeks gestation of pregnancy: Secondary | ICD-10-CM | POA: Insufficient documentation

## 2021-03-25 DIAGNOSIS — R600 Localized edema: Secondary | ICD-10-CM | POA: Insufficient documentation

## 2021-03-25 DIAGNOSIS — O1493 Unspecified pre-eclampsia, third trimester: Secondary | ICD-10-CM | POA: Insufficient documentation

## 2021-03-25 DIAGNOSIS — R1011 Right upper quadrant pain: Secondary | ICD-10-CM | POA: Insufficient documentation

## 2021-03-25 MED ORDER — BETAMETHASONE SOD PHOS & ACET 6 (3-3) MG/ML IJ SUSP
12.0000 mg | Freq: Once | INTRAMUSCULAR | Status: AC
  Administered 2021-03-25: 12 mg via INTRAMUSCULAR
  Filled 2021-03-25: qty 5

## 2021-03-25 NOTE — Progress Notes (Signed)
Larger blood pressure cuff applied for more accurate blood pressure reading.

## 2021-03-25 NOTE — OB Triage Note (Addendum)
Pt is a G1P0 at [redacted]w[redacted]d complaining of decreased fetal movement. Denies LOF, contractions, and vaginal bleeding. Pt endorses having preeclampsia. Endorses increased swelling and pain in RUQ of abdomen.  Denies headache and blurred vision. First BP 173/104. Cuff readjusted and retake BP was 162/105. Monitors applied and assessing.

## 2021-03-25 NOTE — Discharge Summary (Signed)
    L&D OB Triage Note  SUBJECTIVE Vickie Carlson is a 35 y.o. G2P0010 female at [redacted]w[redacted]d, EDD Estimated Date of Delivery: 04/21/21 who presented to triage complaining of decreased fetal movement. Denies LOF, contractions, and vaginal bleeding.She has been followed in the office for possible pre-e.   OB History  Gravida Para Term Preterm AB Living  2 0 0 0 1 0  SAB IAB Ectopic Multiple Live Births  0 1 0 0 0    # Outcome Date GA Lbr Len/2nd Weight Sex Delivery Anes PTL Lv  2 Current           1 IAB             No medications prior to admission.     OBJECTIVE  Nursing Evaluation:   BP (!) 152/87 (BP Location: Right Arm)   Pulse 70   Temp 98.4 F (36.9 C) (Oral)   Resp 16   Ht 5\' 2"  (1.575 m)   Wt 99.8 kg   LMP 07/14/2020 (Exact Date) Comment: G2P0  BMI 40.24 kg/m    Findings:        Initial eval - wrong sized BP cuff employed.      Pt very nervous and excited on admission      Bps with correct cuff and with patient more relaxed much improved          Labs for pre-E reviewed      NST was performed and has been reviewed by me.  NST INTERPRETATION: Category I  Mode: External Baseline Rate (A): 130 bpm Variability: Moderate Accelerations: 15 x 15 Decelerations: None     Contraction Frequency (min): none noted  ASSESSMENT Impression:  1.  Pregnancy:  G2P0010 at [redacted]w[redacted]d , EDD Estimated Date of Delivery: 04/21/21 2.  Reassuring fetal and maternal status 3.  Pre-E without severe features 4.  Preterm  PLAN 1. Warning signs of pre-e discussed.  Pt to inform 04/23/21 if any occur. 2. Discharge home with standard labor precautions given to return to L&D or call the office for problems. 3. Bet-meth given - second shot Monday in office  4. BP check Monday in office at time of second shot 5. Expect delivery prior to term - likely next week (trying to get Beta -meth on board)
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
## 2021-03-25 NOTE — Discharge Instructions (Signed)
Go to [facility] office 03/26/2021 for blood pressure check and second shot of Betamethasone.

## 2021-03-26 ENCOUNTER — Encounter: Payer: Medicaid Other | Admitting: Obstetrics and Gynecology

## 2021-03-26 ENCOUNTER — Inpatient Hospital Stay
Admission: EM | Admit: 2021-03-26 | Discharge: 2021-03-30 | DRG: 806 | Disposition: A | Discharge status: Home / Self Care | Payer: Medicaid Other | Attending: Obstetrics and Gynecology | Admitting: Obstetrics and Gynecology

## 2021-03-26 ENCOUNTER — Other Ambulatory Visit: Payer: Self-pay

## 2021-03-26 ENCOUNTER — Encounter: Payer: Self-pay | Admitting: Obstetrics and Gynecology

## 2021-03-26 DIAGNOSIS — D62 Acute posthemorrhagic anemia: Secondary | ICD-10-CM | POA: Diagnosis not present

## 2021-03-26 DIAGNOSIS — O1414 Severe pre-eclampsia complicating childbirth: Principal | ICD-10-CM | POA: Diagnosis present

## 2021-03-26 DIAGNOSIS — Z3A36 36 weeks gestation of pregnancy: Secondary | ICD-10-CM | POA: Diagnosis not present

## 2021-03-26 DIAGNOSIS — Z23 Encounter for immunization: Secondary | ICD-10-CM | POA: Diagnosis not present

## 2021-03-26 DIAGNOSIS — F32A Depression, unspecified: Secondary | ICD-10-CM | POA: Diagnosis present

## 2021-03-26 DIAGNOSIS — O1494 Unspecified pre-eclampsia, complicating childbirth: Secondary | ICD-10-CM | POA: Diagnosis present

## 2021-03-26 DIAGNOSIS — O99344 Other mental disorders complicating childbirth: Secondary | ICD-10-CM | POA: Diagnosis present

## 2021-03-26 DIAGNOSIS — Z20822 Contact with and (suspected) exposure to covid-19: Secondary | ICD-10-CM | POA: Diagnosis present

## 2021-03-26 DIAGNOSIS — O9081 Anemia of the puerperium: Secondary | ICD-10-CM | POA: Diagnosis not present

## 2021-03-26 DIAGNOSIS — Z87891 Personal history of nicotine dependence: Secondary | ICD-10-CM

## 2021-03-26 DIAGNOSIS — O1413 Severe pre-eclampsia, third trimester: Secondary | ICD-10-CM

## 2021-03-26 DIAGNOSIS — Z8616 Personal history of COVID-19: Secondary | ICD-10-CM

## 2021-03-26 DIAGNOSIS — O1493 Unspecified pre-eclampsia, third trimester: Secondary | ICD-10-CM | POA: Diagnosis present

## 2021-03-26 LAB — RESP PANEL BY RT-PCR (FLU A&B, COVID) ARPGX2
Influenza A by PCR: NEGATIVE
Influenza B by PCR: NEGATIVE
SARS Coronavirus 2 by RT PCR: NEGATIVE

## 2021-03-26 LAB — TYPE AND SCREEN
ABO/RH(D): A POS
Antibody Screen: NEGATIVE

## 2021-03-26 LAB — COMPREHENSIVE METABOLIC PANEL
ALT: 10 U/L (ref 0–44)
AST: 18 U/L (ref 15–41)
Albumin: 2.6 g/dL — ABNORMAL LOW (ref 3.5–5.0)
Alkaline Phosphatase: 110 U/L (ref 38–126)
Anion gap: 6 (ref 5–15)
BUN: 11 mg/dL (ref 6–20)
CO2: 20 mmol/L — ABNORMAL LOW (ref 22–32)
Calcium: 8.2 mg/dL — ABNORMAL LOW (ref 8.9–10.3)
Chloride: 109 mmol/L (ref 98–111)
Creatinine, Ser: 0.51 mg/dL (ref 0.44–1.00)
GFR, Estimated: >60 mL/min (ref >60)
Glucose, Bld: 80 mg/dL (ref 70–99)
Potassium: 3.6 mmol/L (ref 3.5–5.1)
Sodium: 135 mmol/L (ref 135–145)
Total Bilirubin: 0.6 mg/dL (ref 0.3–1.2)
Total Protein: 5.8 g/dL — ABNORMAL LOW (ref 6.5–8.1)

## 2021-03-26 LAB — GROUP B STREP BY PCR: Group B strep by PCR: NEGATIVE

## 2021-03-26 LAB — CBC
HCT: 29.9 % — ABNORMAL LOW (ref 36.0–46.0)
Hemoglobin: 10.2 g/dL — ABNORMAL LOW (ref 12.0–15.0)
MCH: 29.8 pg (ref 26.0–34.0)
MCHC: 34.1 g/dL (ref 30.0–36.0)
MCV: 87.4 fL (ref 80.0–100.0)
Platelets: 295 10*3/uL (ref 150–400)
RBC: 3.42 MIL/uL — ABNORMAL LOW (ref 3.87–5.11)
RDW: 12.9 % (ref 11.5–15.5)
WBC: 18.8 10*3/uL — ABNORMAL HIGH (ref 4.0–10.5)
nRBC: 0 % (ref 0.0–0.2)

## 2021-03-26 LAB — PROTEIN / CREATININE RATIO, URINE
Creatinine, Urine: 128 mg/dL
Protein Creatinine Ratio: 1.85 mg/mg{Cre} — ABNORMAL HIGH (ref 0.00–0.15)
Total Protein, Urine: 237 mg/dL

## 2021-03-26 MED ORDER — LACTATED RINGERS IV SOLN
INTRAVENOUS | Status: DC
  Administered 2021-03-27: 1000 mL via INTRAVENOUS

## 2021-03-26 MED ORDER — MISOPROSTOL 200 MCG PO TABS
ORAL_TABLET | ORAL | Status: AC
  Filled 2021-03-26: qty 4

## 2021-03-26 MED ORDER — LACTATED RINGERS IV SOLN: 500.0000 mL | INTRAVENOUS | Status: DC | PRN

## 2021-03-26 MED ORDER — OXYTOCIN-SODIUM CHLORIDE 30-0.9 UT/500ML-% IV SOLN
2.5000 [IU]/h | INTRAVENOUS | Status: DC
  Administered 2021-03-27 – 2021-03-28 (×2): 2.5 [IU]/h via INTRAVENOUS
  Filled 2021-03-26: qty 1000

## 2021-03-26 MED ORDER — TERBUTALINE SULFATE 1 MG/ML IJ SOLN: 0.2500 mg | Freq: Once | INTRAMUSCULAR | Status: DC | PRN

## 2021-03-26 MED ORDER — SOD CITRATE-CITRIC ACID 500-334 MG/5ML PO SOLN: 30.0000 mL | ORAL | Status: DC | PRN

## 2021-03-26 MED ORDER — OXYTOCIN BOLUS FROM INFUSION
333.0000 mL | Freq: Once | INTRAVENOUS | Status: AC
  Administered 2021-03-27: 333 mL via INTRAVENOUS

## 2021-03-26 MED ORDER — ACETAMINOPHEN 325 MG PO TABS: 650.0000 mg | ORAL_TABLET | ORAL | Status: DC | PRN

## 2021-03-26 MED ORDER — BETAMETHASONE SOD PHOS & ACET 6 (3-3) MG/ML IJ SUSP
12.0000 mg | Freq: Once | INTRAMUSCULAR | Status: AC
  Administered 2021-03-26: 12 mg via INTRAMUSCULAR
  Filled 2021-03-26: qty 5

## 2021-03-26 MED ORDER — ZOLPIDEM TARTRATE 5 MG PO TABS
5.0000 mg | ORAL_TABLET | Freq: Every evening | ORAL | Status: DC | PRN
  Administered 2021-03-26: 5 mg via ORAL
  Filled 2021-03-26: qty 1

## 2021-03-26 MED ORDER — OXYCODONE-ACETAMINOPHEN 5-325 MG PO TABS: 1.0000 | ORAL_TABLET | ORAL | Status: DC | PRN

## 2021-03-26 MED ORDER — AMMONIA AROMATIC IN INHA
RESPIRATORY_TRACT | Status: AC
  Filled 2021-03-26: qty 10

## 2021-03-26 MED ORDER — OXYTOCIN 10 UNIT/ML IJ SOLN
INTRAMUSCULAR | Status: AC
  Filled 2021-03-26: qty 2

## 2021-03-26 MED ORDER — MISOPROSTOL 25 MCG QUARTER TABLET
50.0000 ug | ORAL_TABLET | ORAL | Status: DC | PRN
  Administered 2021-03-26 – 2021-03-27 (×4): 50 ug via VAGINAL
  Filled 2021-03-26 (×4): qty 1
  Filled 2021-03-26: qty 2

## 2021-03-26 MED ORDER — OXYCODONE-ACETAMINOPHEN 5-325 MG PO TABS: 2.0000 | ORAL_TABLET | ORAL | Status: DC | PRN

## 2021-03-26 MED ORDER — VENLAFAXINE HCL ER 75 MG PO CP24
75.0000 mg | ORAL_CAPSULE | Freq: Every day | ORAL | Status: DC
  Administered 2021-03-27 – 2021-03-30 (×4): 75 mg via ORAL
  Filled 2021-03-26 (×4): qty 1

## 2021-03-26 MED ORDER — ONDANSETRON HCL 4 MG/2ML IJ SOLN: 4.0000 mg | Freq: Four times a day (QID) | INTRAMUSCULAR | Status: DC | PRN

## 2021-03-26 MED ORDER — NIFEDIPINE 10 MG PO CAPS
10.0000 mg | ORAL_CAPSULE | ORAL | Status: DC | PRN
  Filled 2021-03-26: qty 1

## 2021-03-26 MED ORDER — NIFEDIPINE 10 MG PO CAPS
20.0000 mg | ORAL_CAPSULE | ORAL | Status: DC | PRN
  Filled 2021-03-26: qty 2

## 2021-03-26 MED ORDER — BUTORPHANOL TARTRATE 1 MG/ML IJ SOLN
1.0000 mg | INTRAMUSCULAR | Status: DC | PRN
  Administered 2021-03-27 (×2): 1 mg via INTRAVENOUS
  Filled 2021-03-26 (×2): qty 1

## 2021-03-26 MED ORDER — LIDOCAINE HCL (PF) 1 % IJ SOLN
30.0000 mL | INTRAMUSCULAR | Status: DC | PRN
  Filled 2021-03-26: qty 30

## 2021-03-26 MED ORDER — LABETALOL HCL 5 MG/ML IV SOLN
40.0000 mg | INTRAVENOUS | Status: DC | PRN
  Administered 2021-03-26: 40 mg via INTRAVENOUS
  Filled 2021-03-26: qty 8

## 2021-03-26 MED ORDER — LIDOCAINE HCL (PF) 1 % IJ SOLN
INTRAMUSCULAR | Status: AC
  Filled 2021-03-26: qty 30
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
## 2021-03-26 NOTE — Progress Notes (Signed)
Intrapartum Progress Note  S: Patient doing well, no major complaints.   O: Blood pressure (!) 146/80, pulse 78, temperature 98.8 F (37.1 C), temperature source Oral, resp. rate 16, height 5\' 2"  (1.575 m), weight 55.3 kg, last menstrual period 07/14/2020. Gen App: NAD, comfortable Abdomen: soft, gravid FHT: baseline 130 bpm.  Accels present.  Decels absent. moderate in degree variability.   Tocometer: None. Uterine irritability present Cervix: 0.5/thick Extremities: Nontender, no edema. Neurologic: DTRs present, 2+  Pitocin: None  Labs:  GBS negative  Assessment:  1: SIUP at [redacted]w[redacted]d 2. Pre-eclampsia with severe features  Plan:  1. Continue IOL with Cytotec. Received second dose at ~ 8:00 pm.  2. Continue Procardia as needed for severe range BPs.  Has received only 1 dose so far. Remains asymptomatic. Will hold on initiation of Mag sulfate for now.    [redacted]w[redacted]d, MD 03/26/2021 8:39 PM

## 2021-03-27 ENCOUNTER — Inpatient Hospital Stay: Payer: Medicaid Other | Admitting: Anesthesiology

## 2021-03-27 ENCOUNTER — Encounter: Payer: Medicaid Other | Admitting: Obstetrics and Gynecology

## 2021-03-27 DIAGNOSIS — Z3483 Encounter for supervision of other normal pregnancy, third trimester: Secondary | ICD-10-CM

## 2021-03-27 DIAGNOSIS — Z3A36 36 weeks gestation of pregnancy: Secondary | ICD-10-CM

## 2021-03-27 DIAGNOSIS — O1413 Severe pre-eclampsia, third trimester: Secondary | ICD-10-CM | POA: Diagnosis not present

## 2021-03-27 DIAGNOSIS — Z113 Encounter for screening for infections with a predominantly sexual mode of transmission: Secondary | ICD-10-CM

## 2021-03-27 LAB — CBC WITH DIFFERENTIAL/PLATELET
Abs Immature Granulocytes: 0.37 10*3/uL — ABNORMAL HIGH (ref 0.00–0.07)
Basophils Absolute: 0 10*3/uL (ref 0.0–0.1)
Basophils Relative: 0 %
Eosinophils Absolute: 0 10*3/uL (ref 0.0–0.5)
Eosinophils Relative: 0 %
HCT: 30 % — ABNORMAL LOW (ref 36.0–46.0)
Hemoglobin: 10.2 g/dL — ABNORMAL LOW (ref 12.0–15.0)
Immature Granulocytes: 2 %
Lymphocytes Relative: 12 %
Lymphs Abs: 2.5 10*3/uL (ref 0.7–4.0)
MCH: 29.7 pg (ref 26.0–34.0)
MCHC: 34 g/dL (ref 30.0–36.0)
MCV: 87.2 fL (ref 80.0–100.0)
Monocytes Absolute: 1.6 10*3/uL — ABNORMAL HIGH (ref 0.1–1.0)
Monocytes Relative: 7 %
Neutro Abs: 17 10*3/uL — ABNORMAL HIGH (ref 1.7–7.7)
Neutrophils Relative %: 79 %
Platelets: 322 10*3/uL (ref 150–400)
RBC: 3.44 MIL/uL — ABNORMAL LOW (ref 3.87–5.11)
RDW: 13.1 % (ref 11.5–15.5)
WBC: 21.5 10*3/uL — ABNORMAL HIGH (ref 4.0–10.5)
nRBC: 0 % (ref 0.0–0.2)

## 2021-03-27 LAB — RPR: RPR Ser Ql: NONREACTIVE

## 2021-03-27 MED ORDER — CALCIUM CARBONATE ANTACID 500 MG PO CHEW
400.0000 mg | CHEWABLE_TABLET | Freq: Every day | ORAL | Status: AC
  Administered 2021-03-27: 400 mg via ORAL
  Filled 2021-03-27: qty 2

## 2021-03-27 MED ORDER — FENTANYL-BUPIVACAINE-NACL 0.5-0.125-0.9 MG/250ML-% EP SOLN: 12.0000 mL/h | EPIDURAL | Status: DC | PRN

## 2021-03-27 MED ORDER — PHENYLEPHRINE 40 MCG/ML (10ML) SYRINGE FOR IV PUSH (FOR BLOOD PRESSURE SUPPORT)
80.0000 ug | PREFILLED_SYRINGE | INTRAVENOUS | Status: DC | PRN
  Filled 2021-03-27: qty 10

## 2021-03-27 MED ORDER — OXYTOCIN-SODIUM CHLORIDE 30-0.9 UT/500ML-% IV SOLN
1.0000 m[IU]/min | INTRAVENOUS | Status: DC
  Administered 2021-03-27: 4 m[IU]/min via INTRAVENOUS
  Administered 2021-03-27: 2 m[IU]/min via INTRAVENOUS

## 2021-03-27 MED ORDER — LACTATED RINGERS IV SOLN
500.0000 mL | Freq: Once | INTRAVENOUS | Status: AC
  Administered 2021-03-27: 500 mL via INTRAVENOUS

## 2021-03-27 MED ORDER — LIDOCAINE-EPINEPHRINE (PF) 1.5 %-1:200000 IJ SOLN
INTRAMUSCULAR | Status: DC | PRN
  Administered 2021-03-27: 3 mL via EPIDURAL

## 2021-03-27 MED ORDER — LACTATED RINGERS AMNIOINFUSION
INTRAVENOUS | Status: DC
  Filled 2021-03-27 (×3): qty 1000

## 2021-03-27 MED ORDER — FENTANYL-BUPIVACAINE-NACL 0.5-0.125-0.9 MG/250ML-% EP SOLN
EPIDURAL | Status: AC
  Filled 2021-03-27: qty 250

## 2021-03-27 MED ORDER — LIDOCAINE HCL (PF) 1 % IJ SOLN
INTRAMUSCULAR | Status: DC | PRN
  Administered 2021-03-27: 3 mL via SUBCUTANEOUS

## 2021-03-27 MED ORDER — EPHEDRINE 5 MG/ML INJ
10.0000 mg | INTRAVENOUS | Status: DC | PRN
  Filled 2021-03-27: qty 2

## 2021-03-27 MED ORDER — FENTANYL-BUPIVACAINE-NACL 0.5-0.125-0.9 MG/250ML-% EP SOLN
EPIDURAL | Status: DC | PRN
  Administered 2021-03-27: 12 mL/h via EPIDURAL

## 2021-03-27 MED ORDER — BUPIVACAINE HCL (PF) 0.25 % IJ SOLN
INTRAMUSCULAR | Status: DC | PRN
  Administered 2021-03-27: 4 mL via EPIDURAL
  Administered 2021-03-27: 3 mL via EPIDURAL

## 2021-03-27 MED ORDER — DIPHENHYDRAMINE HCL 50 MG/ML IJ SOLN: 12.5000 mg | INTRAMUSCULAR | Status: DC | PRN

## 2021-03-27 NOTE — Progress Notes (Signed)
Intrapartum Progress Note  S: Patient notes some cramping.   O:  Vitals:   03/27/21 0532 03/27/21 0602 03/27/21 0632 03/27/21 0700  BP: (!) 153/85 (!) 157/89 (!) 152/83 (!) 152/84  Pulse: 69 72 73 73  Resp:    16  Temp:    99 F (37.2 C)  TempSrc:    Oral  Weight:      Height:        Gen App: NAD, comfortable Abdomen: soft, gravid FHT: baseline 135 bpm.  Accels present.  Decels absent.  Moderate in degree variability.   Tocometer: Irregular contractions 2-6 minutes Cervix: 1/50/-3 Extremities: Nontender, no edema. Neurologic: DTRs present, 2+  Pitocin: None  Labs:  No new labs  Assessment:  1: SIUP at [redacted]w[redacted]d 2. Pre-eclampsia with severe features  Plan:  1. Continue IOL with Cytotec. Received third dose at ~ 2:00 a.m. Foley bulb placed and next dose of Cytotec placed.  2. Continue Procardia as needed for severe range BPs.  Has received only 1 dose so far. Remains asymptomatic. Will hold on initiation of Mag sulfate for now.    Hildred Laser, MD 03/27/2021 8:27 AM

## 2021-03-27 NOTE — Anesthesia Preprocedure Evaluation (Addendum)
Anesthesia Evaluation  Patient identified by MRN, date of birth, ID band Patient awake    Reviewed: Allergy & Precautions, H&P , NPO status , Patient's Chart, lab work & pertinent test results  Airway Mallampati: II  TM Distance: >3 FB Neck ROM: full    Dental   Pulmonary former smoker,    Pulmonary exam normal        Cardiovascular hypertension, On Medications Normal cardiovascular exam     Neuro/Psych PSYCHIATRIC DISORDERS Depression negative neurological ROS     GI/Hepatic Neg liver ROS, GERD  Medicated,  Endo/Other  negative endocrine ROS  Renal/GU negative Renal ROS  negative genitourinary   Musculoskeletal   Abdominal   Peds  Hematology Elevated WBC. No signs of active signs or symptoms   Anesthesia Other Findings Elevated WBC count, no other active signs of infection   Past Medical History: No date: Depression No date: Hypertension No date: Tracheobronchomalacia   Reproductive/Obstetrics (+) Pregnancy                            Anesthesia Physical Anesthesia Plan  ASA: 2  Anesthesia Plan: Epidural   Post-op Pain Management:    Induction:   PONV Risk Score and Plan:   Airway Management Planned: Natural Airway  Additional Equipment:   Intra-op Plan:   Post-operative Plan:   Informed Consent: I have reviewed the patients History and Physical, chart, labs and discussed the procedure including the risks, benefits and alternatives for the proposed anesthesia with the patient or authorized representative who has indicated his/her understanding and acceptance.     Dental Advisory Given  Plan Discussed with: Anesthesiologist and CRNA  Anesthesia Plan Comments: (Patient reports no bleeding problems and no anticoagulant use.   Patient consented for risks of anesthesia including but not limited to:  - adverse reactions to medications - risk of bleeding, infection and or  nerve damage from epidural that could lead to paralysis - risk of headache or failed epidural - nerve damage due to positioning - that if epidural is used for C-section that there is a chance of epidural failure requiring spinal placement or conversion to GA - Damage to heart, brain, lungs, other parts of body or loss of life  Patient voiced understanding.)       Anesthesia Quick Evaluation

## 2021-03-27 NOTE — Progress Notes (Addendum)
Intrapartum Progress Note  S: Patient comfortabzle with epidural.   O:  Vitals:   03/27/21 1425 03/27/21 1428 03/27/21 1612 03/27/21 1625  BP: (!) 158/80 (!) 151/80 (!) 160/91 (!) 148/73  Pulse: 60 61 72 65  Resp:    16  Temp:    98.5 F (36.9 C)  TempSrc:    Oral  SpO2: 100%   100%  Weight:      Height:        Gen App: NAD, comfortable Abdomen: soft, gravid FHT: baseline 130 bpm.  Accels present.  Decels present - late deceleration from baseline down to 100s for 3 minutes at ~ 3:00 pm.  Moderate in degree variability.   Tocometer: Unable to detect, appear irregular Cervix: Dilation: 5 Effacement (%): 70 Station: -2 Exam by:: GCK  Extremities: Nontender, no edema. Neurologic: DTRs present, 2+  Pitocin: 10 mIU  Labs:  No new labs  Assessment:  1: SIUP at [redacted]w[redacted]d 2. Pre-eclampsia with severe features  Plan:  1. IUPC placed.  2. Continue augmentation with Pitocin. 2. Continue Procardia as needed for severe range BPs.  Has received only 1 dose so far. Remains asymptomatic. Will hold on initiation of Mag sulfate for now.    Hildred Laser, MD 03/27/2021 5:19 PM

## 2021-03-27 NOTE — Progress Notes (Signed)
Intrapartum Progress Note  S: Patient without complaints.  O:  Vitals:   03/27/21 1940 03/27/21 1945 03/27/21 1950 03/27/21 1955  BP:      Pulse:      Resp:      Temp:      TempSrc:      SpO2: 95% 95% 95% 96%  Weight:      Height:        Gen App: NAD, comfortable Abdomen: soft, gravid FHT: baseline 130 bpm.  Accels present.  Decels  variable decelerations present   Moderate in degree variability.   Tocometer: Irregular, q 1-5 minutes, couplets and triplets present Cervix: Dilation: 6 Effacement (%): 70 Station: -1 Presentation: Vertex Exam by:: J Guptill RN  Extremities: Nontender, trace edema. Neurologic: DTRs present, 2+  Pitocin: 6 mIU  Labs:  CBC CBC Latest Ref Rng & Units 03/27/2021 03/26/2021 03/22/2021  WBC 4.0 - 10.5 K/uL 21.5(H) 18.8(H) 14.3(H)  Hemoglobin 12.0 - 15.0 g/dL 10.2(L) 10.2(L) 11.3  Hematocrit 36.0 - 46.0 % 30.0(L) 29.9(L) 34.1  Platelets 150 - 400 K/uL 322 295 336      Assessment:  1: SIUP at [redacted]w[redacted]d 2. Pre-eclampsia with severe features  Plan:  1.  Pitocin initially held for decelerations, now resumed. Patient now in left lateral position, with improvement in fetal tracing.  Will also start amnioinfusion. Will titrate pitocin slowly.  2. Continue Procardia as needed for severe range BPs.  Has received only 1 dose so far in labor. Remains asymptomatic. Will hold on initiation of Mag sulfate for now.    Hildred Laser, MD 03/27/2021 8:38 PM

## 2021-03-27 NOTE — Anesthesia Procedure Notes (Signed)
Epidural Patient location during procedure: OB Start time: 03/27/2021 2:15 PM End time: 03/27/2021 2:25 PM  Staffing Anesthesiologist: Piscitello, Cleda Mccreedy, MD Resident/CRNA: Karoline Caldwell, CRNA Performed: resident/CRNA   Preanesthetic Checklist Completed: patient identified, IV checked, site marked, risks and benefits discussed, surgical consent, monitors and equipment checked, pre-op evaluation and timeout performed  Epidural Patient position: sitting Prep: ChloraPrep Patient monitoring: heart rate, continuous pulse ox and blood pressure Approach: midline Location: L3-L4 Injection technique: LOR air  Needle:  Needle type: Tuohy  Needle gauge: 17 G Needle length: 9 cm and 9 Needle insertion depth: 8 cm Catheter type: closed end flexible Catheter size: 19 Gauge Catheter at skin depth: 13 cm Test dose: negative and 1.5% lidocaine with Epi 1:200 K  Assessment Events: blood not aspirated, injection not painful, no injection resistance, no paresthesia and negative IV test  Additional Notes 1 attempt Pt. Evaluated and documentation done after procedure finished. Patient identified. Risks/Benefits/Options discussed with patient including but not limited to bleeding, infection, nerve damage, paralysis, failed block, incomplete pain control, headache, blood pressure changes, nausea, vomiting, reactions to medication both or allergic, itching and postpartum back pain. Confirmed with bedside nurse the patient's most recent platelet count. Confirmed with patient that they are not currently taking any anticoagulation, have any bleeding history or any family history of bleeding disorders. Patient expressed understanding and wished to proceed. All questions were answered. Sterile technique was used throughout the entire procedure. Please see nursing notes for vital signs. Test dose was given through epidural catheter and negative prior to continuing to dose epidural or start infusion. Warning  signs of high block given to the patient including shortness of breath, tingling/numbness in hands, complete motor block, or any concerning symptoms with instructions to call for help. Patient was given instructions on fall risk and not to get out of bed. All questions and concerns addressed with instructions to call with any issues or inadequate analgesia.    Patient tolerated the insertion well without immediate complications.Reason for block:procedure for pain

## 2021-03-27 NOTE — Progress Notes (Addendum)
Intrapartum Progress Note  S: Patient feeling some mild to moderate pain with contractions. Foley bulb expelled at ~ 11 a.m.  O:  Vitals:   03/27/21 0700 03/27/21 0853 03/27/21 1020 03/27/21 1205  BP: (!) 152/84  (!) 156/83   Pulse: 73  61   Resp: 16 16 16 18   Temp: 99 F (37.2 C)   98.9 F (37.2 C)  TempSrc: Oral   Oral  Weight:      Height:        Gen App: NAD, comfortable Abdomen: soft, gravid FHT: baseline 135 bpm.  Accels present.  Decels absent.  Moderate in degree variability.   Tocometer: Irregular contractions 1-4 minutes Cervix: Dilation: 4 Effacement (%): 60 Station: -3 Exam by:: g kirby RNC  Extremities: Nontender, no edema. Neurologic: DTRs present, 2+  Pitocin: None  Labs:  No new labs  Assessment:  1: SIUP at [redacted]w[redacted]d 2. Pre-eclampsia with severe features  Plan:  1. Start augmentation with Pitocin. AROM'd with clear fluid.  2. Continue Procardia as needed for severe range BPs.  Has received only 1 dose so far. Remains asymptomatic. Will hold on initiation of Mag sulfate for now.    [redacted]w[redacted]d, MD 03/27/2021 12:18 PM

## 2021-03-28 ENCOUNTER — Encounter: Payer: Self-pay | Admitting: Obstetrics and Gynecology

## 2021-03-28 LAB — CBC
HCT: 27.7 % — ABNORMAL LOW (ref 36.0–46.0)
Hemoglobin: 9 g/dL — ABNORMAL LOW (ref 12.0–15.0)
MCH: 29.1 pg (ref 26.0–34.0)
MCHC: 32.5 g/dL (ref 30.0–36.0)
MCV: 89.6 fL (ref 80.0–100.0)
Platelets: 275 10*3/uL (ref 150–400)
RBC: 3.09 MIL/uL — ABNORMAL LOW (ref 3.87–5.11)
RDW: 13 % (ref 11.5–15.5)
WBC: 27.9 10*3/uL — ABNORMAL HIGH (ref 4.0–10.5)
nRBC: 0 % (ref 0.0–0.2)

## 2021-03-28 MED ORDER — ONDANSETRON HCL 4 MG PO TABS: 4.0000 mg | ORAL_TABLET | ORAL | Status: DC | PRN

## 2021-03-28 MED ORDER — COCONUT OIL OIL
1.0000 "application " | TOPICAL_OIL | Status: DC | PRN
  Filled 2021-03-28: qty 120

## 2021-03-28 MED ORDER — DIPHENHYDRAMINE HCL 25 MG PO CAPS: 25.0000 mg | ORAL_CAPSULE | Freq: Four times a day (QID) | ORAL | Status: DC | PRN

## 2021-03-28 MED ORDER — DIBUCAINE (PERIANAL) 1 % EX OINT: 1.0000 "application " | TOPICAL_OINTMENT | CUTANEOUS | Status: DC | PRN

## 2021-03-28 MED ORDER — IBUPROFEN 600 MG PO TABS
600.0000 mg | ORAL_TABLET | Freq: Four times a day (QID) | ORAL | Status: DC
  Administered 2021-03-28 – 2021-03-30 (×11): 600 mg via ORAL
  Filled 2021-03-28 (×11): qty 1

## 2021-03-28 MED ORDER — NIFEDIPINE ER OSMOTIC RELEASE 30 MG PO TB24
30.0000 mg | ORAL_TABLET | Freq: Two times a day (BID) | ORAL | Status: DC
  Administered 2021-03-28 – 2021-03-30 (×5): 30 mg via ORAL
  Filled 2021-03-28 (×5): qty 1

## 2021-03-28 MED ORDER — SIMETHICONE 80 MG PO CHEW: 80.0000 mg | CHEWABLE_TABLET | ORAL | Status: DC | PRN

## 2021-03-28 MED ORDER — BENZOCAINE-MENTHOL 20-0.5 % EX AERO
1.0000 "application " | INHALATION_SPRAY | CUTANEOUS | Status: DC | PRN
  Administered 2021-03-28: 1 via TOPICAL
  Filled 2021-03-28: qty 56

## 2021-03-28 MED ORDER — ACETAMINOPHEN 325 MG PO TABS: 650.0000 mg | ORAL_TABLET | ORAL | Status: DC | PRN

## 2021-03-28 MED ORDER — SENNOSIDES-DOCUSATE SODIUM 8.6-50 MG PO TABS
2.0000 | ORAL_TABLET | Freq: Every day | ORAL | Status: DC
  Administered 2021-03-28 – 2021-03-30 (×3): 2 via ORAL
  Filled 2021-03-28 (×3): qty 2

## 2021-03-28 MED ORDER — FUROSEMIDE 20 MG PO TABS
20.0000 mg | ORAL_TABLET | Freq: Every day | ORAL | Status: DC
  Administered 2021-03-28: 20 mg via ORAL
  Filled 2021-03-28 (×2): qty 1

## 2021-03-28 MED ORDER — PRENATAL MULTIVITAMIN CH
1.0000 | ORAL_TABLET | Freq: Every day | ORAL | Status: DC
  Administered 2021-03-28 – 2021-03-30 (×3): 1 via ORAL
  Filled 2021-03-28 (×3): qty 1

## 2021-03-28 MED ORDER — ZOLPIDEM TARTRATE 5 MG PO TABS: 5.0000 mg | ORAL_TABLET | Freq: Every evening | ORAL | Status: DC | PRN

## 2021-03-28 MED ORDER — WITCH HAZEL-GLYCERIN EX PADS: 1.0000 "application " | MEDICATED_PAD | CUTANEOUS | Status: DC | PRN

## 2021-03-28 MED ORDER — ONDANSETRON HCL 4 MG/2ML IJ SOLN: 4.0000 mg | INTRAMUSCULAR | Status: DC | PRN

## 2021-03-28 NOTE — Anesthesia Postprocedure Evaluation (Signed)
Anesthesia Post Note  Patient: Vickie Carlson  Procedure(s) Performed: AN AD HOC LABOR EPIDURAL  Patient location during evaluation: Mother Baby Anesthesia Type: Epidural Level of consciousness: awake and alert Pain management: pain level controlled Vital Signs Assessment: post-procedure vital signs reviewed and stable Respiratory status: spontaneous breathing, nonlabored ventilation and respiratory function stable Cardiovascular status: stable Postop Assessment: no headache, no backache and epidural receding Anesthetic complications: no   No notable events documented.   Last Vitals:  Vitals:   03/28/21 0317 03/28/21 0415  BP: (!) 159/84 (!) 141/86  Pulse: 70   Resp:    Temp:    SpO2: 98%     Last Pain:  Vitals:   03/28/21 0316  TempSrc: Oral  PainSc:                  Rosanne Gutting

## 2021-03-28 NOTE — Lactation Note (Signed)
This note was copied from a baby's chart. Lactation Consultation Note  Patient Name: Vickie Carlson [id number] Date: 03/28/2021 Reason for consult: Initial assessment;Primapara;Late-preterm 34-36.6wks;Infant < 6lbs Age:34 hours  Initial lactation visit. Mom is P1, SVD 11 hrs ago to preterm baby at 36w3 days. Mom desires breastfeeding and has been set-up with pump, syringe and possible nipple shield (although mom declines this)- Nipple shield was given for inverted nipples.  LC and parents discussed preterm feeding protocol, importance of pump use every 3 hours, infants stamina and strength at the breast, minimizing time of feed to or less, and supplement based on feeding behavior. Mom verbalizes understanding and is on board with plan. Plan: -Baby to be fed every 3-4 hours (breast first+supplement)- feeding limited to 30 minutes -Pump every 3 hours  Baby last fed at 0800; next anticipated feeding around 11am. Whiteboard updated with LC name/number; LC presence at next feeding attempt.  Maternal Data Does the patient have breastfeeding experience prior to this delivery?: No  Feeding Mother's Current Feeding Choice: Breast Milk  LATCH Score                    Lactation Tools Discussed/Used Tools: Pump Breast pump type: Double-Electric Breast Pump Reason for Pumping: preterm protocol Pumping frequency: q 3 hours  Interventions Interventions: Breast feeding basics reviewed;Hand express;DEBP;Education (preterm feeding protocol)  Discharge    Consult Status Consult Status: Follow-up Date: 03/28/21 Follow-up type: In-patient    Danford Bad 03/28/2021, 9:53 AM

## 2021-03-28 NOTE — Progress Notes (Signed)
Post Partum Day # 1, s/p SVD. Pre-eclampsia with severe features, postpartum hemorrhage  Subjective: no complaints, up ad lib, voiding, and tolerating PO  Objective: Temp:  [98 F (36.7 C)-98.9 F (37.2 C)] 98.5 F (36.9 C) (11/23 0316) Pulse Rate:  [59-90] 70 (11/23 0317) Resp:  [16-18] 18 (11/23 0316) BP: (122-168)/(58-113) 141/86 (11/23 0415) SpO2:  [89 %-100 %] 98 % (11/23 0317)  Physical Exam:  General: alert and no distress  Lungs: clear to auscultation bilaterally Breasts: normal appearance, no masses or tenderness Heart: regular rate and rhythm, S1, S2 normal, no murmur, click, rub or gallop Abdomen: soft, non-tender; bowel sounds normal; no masses,  no organomegaly Pelvis: Lochia: appropriate, Uterine Fundus: firm Extremities: DVT Evaluation: No evidence of DVT seen on physical exam. Negative Homan's sign. No cords or calf tenderness. Calf/Ankle edema is present, +1 pitting edema.   Recent Labs    03/27/21 1329 03/28/21 0550  HGB 10.2* 9.0*  HCT 30.0* 27.7*    Assessment/Plan: Breastfeeding, Lactation consult Circumcision prior to discharge Contraception considering IUD Postpartum hemorrhage, however only mild decrease in Hgb. Asymptomatic anemia at this time. Will treat with PO iron.  BPs mildly elevated overnight. Will start PO Procardia.  Also will give dose of Lasix for edema.    LOS: 2 days   Hildred Laser, MD [facility] 03/28/2021 8:26 AM

## 2021-03-28 NOTE — Lactation Note (Signed)
This note was copied from a baby's chart. Lactation Consultation Note  Patient Name: Vickie Carlson [id number] Date: 03/28/2021   Age:34 hours   Lactation called to assist with feeding by RN. Parents report allowing baby time at the breast already but he appeared gaggy and uncomfortable.   Mom had baby upright and was attempting to feed baby but said she was having a hard time and immediately handed LC the baby.  LC demonstrated once again how to have baby upright, supported, and in left side lying position. Nipple placed to the mouth with slight twisting back and forth and baby accepted well and tolerated all 65mL feed without problems, breaks for burping.   Parents encouraged to keep baby upright after feed to help with spit-up episodes, and continual efforts of pumping, time at the breast and supplementation.   Mom did pump at 2pm, stated she received more than before but she spilled it. Praised her for her dedication and staying on schedule.   Maternal Data    Feeding Nipple Type: Slow - flow  LATCH Score                    Lactation Tools Discussed/Used    Interventions Interventions: Pace feeding  Discharge    Consult Status Consult Status: Follow-up Date: 03/28/21 Follow-up type: Call as needed    Danford Bad 03/28/2021, 3:27 PM

## 2021-03-28 NOTE — Lactation Note (Signed)
This note was copied from a baby's chart. Lactation Consultation Note  Patient Name: Vickie Carlson INOMV'E Date: 03/28/2021 Reason for consult: Follow-up assessment;Primapara;Late-preterm 34-36.6wks;Infant < 6lbs Age:34 hours  Lactation follow-up for feeding support. Mom had placed baby at breast to lick and attempt feeding, but baby was not eager or willing at this time.  LC warmed and fed 59mL DBM in supported upright/side-lying position demonstrating to mom throughout feeding positioning and paced feeding. Baby tolerated 16mL well with breaks and burping.   LC re-swaddled baby and placed back in bassinet. LC assisted with placement of breast flanges and starting of pump for this pumping session. Mom instructed to place expressed milk on counter and this can be used at next feeding. Mom instructed on next feeding efforts to be at 2pm.  Maternal Data Has patient been taught Hand Expression?: Yes Does the patient have breastfeeding experience prior to this delivery?: No  Feeding Mother's Current Feeding Choice: Breast Milk Nipple Type: Slow - flow  LATCH Score                    Lactation Tools Discussed/Used Tools: Pump Breast pump type: Double-Electric Breast Pump Reason for Pumping: preterm protocol Pumping frequency: q 3 hrs  Interventions Interventions: Pace feeding;Education;DEBP  Discharge    Consult Status Consult Status: Follow-up Date: 03/28/21 Follow-up type: In-patient    Danford Bad 03/28/2021, 11:27 AM

## 2021-03-29 MED ORDER — ACETAMINOPHEN 500 MG PO TABS
1000.0000 mg | ORAL_TABLET | Freq: Four times a day (QID) | ORAL | Status: DC | PRN
  Administered 2021-03-29: 1000 mg via ORAL
  Filled 2021-03-29: qty 2

## 2021-03-29 NOTE — Progress Notes (Addendum)
Progress Note - Vaginal Delivery  Vickie Carlson is a 35 y.o. [phone] now PP day 1.5 s/p Vaginal, Spontaneous .   Subjective:  The patient reports c/o headache today.   She is still trying to breastfeed, but a little frustrated because her milk has not come in yet. Denies lightheadedness  Objective:  Vital signs in last 24 hours: Temp:  [97.9 F (36.6 C)-98.8 F (37.1 C)] 98.3 F (36.8 C) (11/24 0822) Pulse Rate:  [76-91] 91 (11/24 0822) Resp:  [18-20] 18 (11/24 0822) BP: (138-155)/(79-94) 149/88 (11/24 0822) SpO2:  [97 %-99 %] 99 % (11/24 7841)  Physical Exam:  General: alert, cooperative, and no distress Lochia: appropriate Uterine Fundus: firm    Data Review Recent Labs    03/27/21 1329 03/28/21 0550  HGB 10.2* 9.0*  HCT 30.0* 27.7*    Assessment/Plan: Principal Problem:   Pre-eclampsia, third trimester Active Problems:   Pre-eclampsia affecting childbirth Bps still elevated. - expect procardia to work by tomorrow.     Plan for discharge tomorrow Continue procardia Continue to work with breastfeeding. Tylenol or Motrin for HA  -- Continue routine PP care.     Elonda Husky, M.D. 03/29/2021 10:42 AM

## 2021-03-29 NOTE — Lactation Note (Signed)
This note was copied from a baby's chart. Lactation Consultation Note  Patient Name: Boy Danitra Payano NWGNF'A Date: 03/29/2021 Reason for consult: Follow-up assessment;Primapara;Late-preterm 34-36.6wks;Infant < 6lbs Age:34 hours  Lactation check-in. Baby was spitty overnight and did get suctioned. Parents report after that was done baby has done much better with his feedings and they are becoming more comfortable with feeding. Last feeding was around 0830 and baby tolerated 34mL well. Mom is concerned about her milk production/pumping efforts. She has started lasix for fluid retention and has noticed that she is no longer receiving "drops". We discussed that this was a temporary concern and temporary treatment and the important thing is to keep up with the stimulation and consistency to ensure that milk production hormones stay active. Mom verbalizes understanding, although emotional for feeling she is not doing well and providing for her baby; again, reassurance given.    Maternal Data Has patient been taught Hand Expression?: Yes Does the patient have breastfeeding experience prior to this delivery?: No  Feeding Mother's Current Feeding Choice: Breast Milk  LATCH Score                    Lactation Tools Discussed/Used Tools: Pump Breast pump type: Double-Electric Breast Pump Pumping frequency: q 3 hours  Interventions Interventions: DEBP;Education;Hand express  Discharge    Consult Status Consult Status: Follow-up Date: 03/29/21 Follow-up type: Call as needed    Danford Bad 03/29/2021, 9:41 AM

## 2021-03-30 MED ORDER — COVID-19MRNA BIVAL VACC PFIZER 30 MCG/0.3ML IM SUSP
0.3000 mL | Freq: Once | INTRAMUSCULAR | Status: AC
  Administered 2021-03-30: 0.3 mL via INTRAMUSCULAR
  Filled 2021-03-30: qty 0.3

## 2021-03-30 MED ORDER — VARICELLA VIRUS VACCINE LIVE 1350 PFU/0.5ML IJ SUSR
0.5000 mL | Freq: Once | INTRAMUSCULAR | Status: AC
  Administered 2021-03-30: 0.5 mL via SUBCUTANEOUS
  Filled 2021-03-30 (×2): qty 0.5

## 2021-03-30 MED ORDER — IBUPROFEN 600 MG PO TABS: 600.0000 mg | ORAL_TABLET | Freq: Four times a day (QID) | ORAL | 0 refills | Status: AC

## 2021-03-30 MED ORDER — NIFEDIPINE ER 30 MG PO TB24: 30.0000 mg | ORAL_TABLET | Freq: Every day | ORAL | 0 refills | Status: AC

## 2021-03-30 MED ORDER — MEASLES, MUMPS & RUBELLA VAC IJ SOLR
0.5000 mL | Freq: Once | INTRAMUSCULAR | Status: AC
Start: 2021-03-30 — End: 2021-03-30
  Administered 2021-03-30: 0.5 mL via SUBCUTANEOUS
  Filled 2021-03-30 (×2): qty 0.5

## 2021-03-30 MED ORDER — COVID-19MRNA BIVAL VACC PFIZER 30 MCG/0.3ML IM SUSP: 0.3000 mL | Freq: Once | INTRAMUSCULAR | Status: DC

## 2021-03-30 NOTE — Progress Notes (Signed)
Patient discharged home with family.  Covid booster shot, MMR and Varicella given. Pt observed for 30 minutes after dose. No s/s of adverse reaction observed.  Discharge instructions, when to follow up, and prescriptions reviewed with patient.  Patient verbalized understanding. Patient will be escorted out by auxiliary.

## 2021-03-30 NOTE — Lactation Note (Signed)
This note was copied from a baby's chart. Lactation Consultation Note  Patient Name: Vickie Carlson HGDJM'E Date: 03/30/2021 Reason for consult: Follow-up assessment;1st time breastfeeding;Late-preterm 34-36.6wks;Infant < 6lbs Age:34 hours Lactation Rounds: LC to the room for a visit. FOB is holding the baby asleep on his chest. Mother states feeds are going well with pumping and supplement. Stated she is only able to express drops and she was able to see milk previously but now not so much. LC taught hand expression, Mother was thrilled she was able to see drops. Encouraged to feed baby the drops she expresses. LC reviewed and encouraged feeding on demand and with cues. Encouraged to pump 8x's/24 hours and at least one of those sessions is overnight. Reviewed baby's gestational age and how he may be sleepy and if he is not cueing by 3 hours from last feed then he needs to be woken for feeds. Reviewed diaper counts for days of life and when to call Peds with questions. Reviewed "understanding Postpartum and Newborn care " booklet at bedside. Reviewed outpatient Lactation number and resources. Parents stated understanding with all teaching.   Maternal Data Has patient been taught Hand Expression?: Yes Does the patient have breastfeeding experience prior to this delivery?: No  Feeding Mother's Current Feeding Choice: Breast Milk and Formula  Interventions Interventions: Breast feeding basics reviewed;Breast massage;Hand express;Breast compression;Expressed milk;DEBP;Hand pump;Education  Discharge Discharge Education: Engorgement and breast care;Warning signs for feeding baby Pump: Manual (unable to rent a DEBP, none available, reviewed manual pump)  Consult Status Consult Status: PRN Follow-up type: Call as needed    Alleigh Mollica D Tucker Steedley 03/30/2021, 11:36 AM

## 2021-03-30 NOTE — Discharge Instructions (Signed)

## 2021-03-30 NOTE — Discharge Summary (Signed)
Patient Name: Vickie Carlson DOB: July 16, 1985 MRN: [id number]                            Discharge Summary  Date of Admission: 03/26/2021 Date of Discharge: 03/30/2021 Delivering Provider: Hildred Laser   Admitting Diagnosis: Pre-eclampsia, third trimester [O14.93] at [redacted]w[redacted]d Secondary diagnosis:  Principal Problem:   Pre-eclampsia, third trimester Active Problems:   Pre-eclampsia affecting childbirth   Mode of Delivery: normal spontaneous vaginal delivery              Discharge diagnosis: Term Pregnancy Delivered      Intrapartum Procedures: epidural and pitocin augmentation   Post partum procedures: Anti-hypotensives  Complications:  sulcus tear laceration                     Discharge Day SOAP Note:  Progress Note - Vaginal Delivery  Vickie Carlson is a 35 y.o. [id number] now PP day 1.5 s/p Vaginal, Spontaneous . Delivery was complicated by pre-eclampsia and cat 2 fetal monitoring.  Subjective  The patient has the following complaints: has no unusual complaints  Pain is controlled with current medications.   Patient is urinating without difficulty.  She is ambulating well.     Objective  Vital signs: BP 137/87 (BP Location: Left Arm)   Pulse 87   Temp 98.6 F (37 C) (Oral)   Resp 18   Ht 5\' 2"  (1.575 m)   Wt 55.3 kg   LMP 07/14/2020 (Exact Date) Comment: G2P0  SpO2 98%   Breastfeeding Unknown   BMI 22.31 kg/m   Physical Exam: Gen: NAD Fundus Fundal Tone: Firm  Lochia Amount: Scant        Data Review Labs: Lab Results  Component Value Date   WBC 27.9 (H) 03/28/2021   HGB 9.0 (L) 03/28/2021   HCT 27.7 (L) 03/28/2021   MCV 89.6 03/28/2021   PLT 275 03/28/2021   CBC Latest Ref Rng & Units 03/28/2021 03/27/2021 03/26/2021  WBC 4.0 - 10.5 K/uL 27.9(H) 21.5(H) 18.8(H)  Hemoglobin 12.0 - 15.0 g/dL 9.0(L) 10.2(L) 10.2(L)  Hematocrit 36.0 - 46.0 % 27.7(L) 30.0(L) 29.9(L)  Platelets 150 - 400 K/uL 275 322 295   A POS  Edinburgh Score: Edinburgh  Postnatal Depression Scale Screening Tool 03/29/2021  I have been able to laugh and see the funny side of things. 0  I have looked forward with enjoyment to things. 0  I have blamed myself unnecessarily when things went wrong. 1  I have been anxious or worried for no good reason. 0  I have felt scared or panicky for no good reason. 0  Things have been getting on top of me. 1  I have been so unhappy that I have had difficulty sleeping. 0  I have felt sad or miserable. 1  I have been so unhappy that I have been crying. 0  The thought of harming myself has occurred to me. 0  Edinburgh Postnatal Depression Scale Total 3    Assessment/Plan  Principal Problem:   Pre-eclampsia, third trimester Active Problems:   Pre-eclampsia affecting childbirth    Plan for discharge today.  Discharge Instructions: Per After Visit Summary. Activity: Advance as tolerated. Pelvic rest for 6 weeks.  Also refer to After Visit Summary Diet: Regular Medications: Allergies as of 03/30/2021       Reactions   Fire Ant Anaphylaxis   Onion Other (See Comments)  Medication List     STOP taking these medications    omeprazole 20 MG capsule Commonly known as: PRILOSEC   PRENATAL PO       TAKE these medications    ibuprofen 600 MG tablet Commonly known as: ADVIL Take 1 tablet (600 mg total) by mouth every 6 (six) hours.   NIFEdipine 30 MG 24 hr tablet Commonly known as: ADALAT CC Take 1 tablet (30 mg total) by mouth daily.   PRENATAL VIT-FE FUM-FA-OMEGA PO Take by mouth.   venlafaxine XR 75 MG 24 hr capsule Commonly known as: EFFEXOR-XR Take 75 mg by mouth daily.       Outpatient follow up:   Follow-up Information     Rubie Maid, MD Follow up in 2 week(s).   Specialties: Obstetrics and Gynecology, Radiology Why: MAy do video visit if desired Contact information: [address] [place] [address] [phone]                Postpartum  contraception: Will discuss at first office visit post-partum  Discharged Condition: good     HTN controlled with Procardia  Discharged to: home  Newborn Data: Disposition:home with mother  Apgars: APGAR (1 MIN): 7   APGAR (5 MINS): 9   APGAR (10 MINS):    Baby Feeding: Bottle and Breast    Finis Bud, M.D. 03/30/2021 10:37 AM

## 2021-03-31 ENCOUNTER — Other Ambulatory Visit: Payer: Self-pay

## 2021-03-31 ENCOUNTER — Encounter: Payer: Self-pay | Admitting: Emergency Medicine

## 2021-03-31 ENCOUNTER — Emergency Department
Admission: EM | Admit: 2021-03-31 | Discharge: 2021-04-01 | Disposition: A | Discharge status: Home / Self Care | Payer: Medicaid Other | Attending: Emergency Medicine | Admitting: Emergency Medicine

## 2021-03-31 DIAGNOSIS — Z8616 Personal history of COVID-19: Secondary | ICD-10-CM | POA: Diagnosis not present

## 2021-03-31 DIAGNOSIS — Z87891 Personal history of nicotine dependence: Secondary | ICD-10-CM | POA: Insufficient documentation

## 2021-03-31 DIAGNOSIS — O901 Disruption of perineal obstetric wound: Secondary | ICD-10-CM | POA: Insufficient documentation

## 2021-03-31 DIAGNOSIS — Z5189 Encounter for other specified aftercare: Secondary | ICD-10-CM

## 2021-03-31 DIAGNOSIS — O165 Unspecified maternal hypertension, complicating the puerperium: Secondary | ICD-10-CM | POA: Insufficient documentation

## 2021-03-31 MED ORDER — NIFEDIPINE ER OSMOTIC RELEASE 30 MG PO TB24
30.0000 mg | ORAL_TABLET | Freq: Once | ORAL | Status: AC
  Administered 2021-04-01: 30 mg via ORAL
  Filled 2021-03-31: qty 1

## 2021-03-31 MED ORDER — BENZOCAINE-MENTHOL 20-0.5 % EX AERO
1.0000 "application " | INHALATION_SPRAY | Freq: Four times a day (QID) | CUTANEOUS | Status: DC | PRN
  Administered 2021-04-01: 1 via TOPICAL
  Filled 2021-03-31 (×2): qty 56

## 2021-03-31 MED ORDER — IBUPROFEN 800 MG PO TABS
800.0000 mg | ORAL_TABLET | Freq: Once | ORAL | Status: AC
  Administered 2021-04-01: 800 mg via ORAL
  Filled 2021-03-31: qty 1

## 2021-03-31 NOTE — ED Triage Notes (Signed)
Pt to ED via POV states delivered Tuesday 11/22, states had perineal stitches placed and states stitches ripped between Tuesday and today. States increased pain at this time.

## 2021-03-31 NOTE — ED Notes (Signed)
Pt vaginal exam completed with This rn and MD Darnelle Catalan

## 2021-03-31 NOTE — Discharge Instructions (Addendum)
Please return for any increased pain swelling pussy discharge fever nausea or any other problems.  Please be sure to keep your follow-up appointment with your [facility] doctor as scheduled.  Do not hesitate to return here for any problems if you cannot get into your [facility] doctor.     I spoke with Dr. Logan Bores just now he wants you to increase the nifedipine by mouth to 1 twice a day.  I will give you a dose here now.  This should get your blood pressure down.  I would get your blood pressure checked again tomorrow afternoon or the day after that about 6 or 8 hours after one of your doses of nifedipine.  If it still remains elevated I will give Dr. Logan Bores a call back.  Also give him a call if you are getting headaches or upper abdominal pain or any other problems like swelling.  This would be very unusual but I would let you know just in case.

## 2021-03-31 NOTE — ED Provider Notes (Signed)
[facility] Emergency Department Provider Note     Event Date/Time   First MD Initiated Contact with Patient 03/31/21 2005     (approximate)  I have reviewed the triage vital signs and the nursing notes.   HISTORY  Chief Complaint Wound Check    HPI Vickie Carlson is a 35 y.o. female in the emergency room patient's blood pressure is 144/96.  Patient feels well.  Patient reports that she delivered on Tuesday.  She had torn at that point posteriorly and had stitches put in.  Sometime between then and today the stitches have ripped.  Is somewhat painful posteriorly and patient's very nervous.  She called the helpline and was told to go to urgent care but there are no urgent care is open so then they called upstairs to labor and delivery and she they told her to come to the emergency room so she came to the emergency room to be checked.        Past Medical History:  Diagnosis Date   Depression    Hypertension    Tracheobronchomalacia     Patient Active Problem List   Diagnosis Date Noted   Pre-eclampsia affecting childbirth 03/26/2021   Pre-eclampsia, third trimester 03/26/2021   Indication for care in labor and delivery, antepartum 03/25/2021   Domestic concerns 02/08/2021   Decreased fetus movements affecting management of mother in second trimester    COVID-19 affecting pregnancy in second trimester 01/08/2021    Past Surgical History:  Procedure Laterality Date   WISDOM TOOTH EXTRACTION      Prior to Admission medications   Medication Sig Start Date End Date Taking? Authorizing Provider  ibuprofen (ADVIL) 600 MG tablet Take 1 tablet (600 mg total) by mouth every 6 (six) hours. 03/30/21   Linzie Collin, MD  NIFEdipine (ADALAT CC) 30 MG 24 hr tablet Take 1 tablet (30 mg total) by mouth daily. 03/30/21   Linzie Collin, MD  PRENATAL VIT-FE FUM-FA-OMEGA PO Take by mouth.    [provider]  venlafaxine XR (EFFEXOR-XR) 75 MG 24 hr  capsule Take 75 mg by mouth daily. 09/13/20   [provider]    Allergies Fire ant and Onion  Family History  Problem Relation Age of Onset   Healthy Mother    Diabetes Father    Bipolar disorder Father    Healthy Maternal Grandmother    Aneurysm Maternal Grandmother     Social History Social History   Tobacco Use   Smoking status: Former    Types: Cigarettes   Smokeless tobacco: Never  Vaping Use   Vaping Use: Never used  Substance Use Topics   Alcohol use: Not Currently   Drug use: Never    Review of Systems  Constitutional: No fever/chills Eyes: No visual changes. ENT: No sore throat. Cardiovascular: Denies chest pain. Respiratory: Denies shortness of breath. Gastrointestinal: No abdominal pain.  No nausea, no vomiting.  No diarrhea.  No constipation. Genitourinary: Negative for dysuria. Musculoskeletal: Negative for back pain. Skin: Negative for rash. Neurological: Negative for headaches, focal weakness  ____________________________________________   PHYSICAL EXAM:  VITAL SIGNS: ED Triage Vitals [03/31/21 1955]  Enc Vitals Group     BP (!) 159/111     Pulse Rate (!) 117     Resp 20     Temp 98.4 F (36.9 C)     Temp Source Oral     SpO2 97 %     Weight  Height      Head Circumference      Peak Flow      Pain Score 3     Pain Loc      Pain Edu?      Excl. in GC?     Constitutional: Alert and oriented. Well appearing and in no acute distress. Eyes: Conjunctivae are normal.  Head: Atraumatic. Nose: No congestion/rhinnorhea. Mouth/Throat: Mucous membranes are moist.  Oropharynx non-erythematous. Neck: No stridor.  Cardiovascular:  Good peripheral circulation. Respiratory: Normal respiratory effort.  No retractions. Lungs CTAB. Gastrointestinal: Soft and nontender. No distention. No abdominal bruits.  Musculoskeletal: No lower extremity tenderness nor edema.   Neurologic:  Normal speech and language. No gross focal neurologic  deficits are appreciated. No gait instability. Skin:  Skin is warm, dry and intact. No rash noted.  ____________________________________________   LABS (all labs ordered are listed, but only abnormal results are displayed)  Labs Reviewed - No data to display ____________________________________________  EKG   ____________________________________________  RADIOLOGY Jill Poling, personally viewed and evaluated these images (plain radiographs) as part of my medical decision making, as well as reviewing the written report by the radiologist.  ED MD interpretation:    Official radiology report(s): No results found.  ____________________________________________   PROCEDURES  Procedure(s) performed (including Critical Care):  Procedures please see below explanation oral consent was obtained after discussing the patient in the wound with Dr. Logan Bores on-call for OB/GYN who was actually one of the patient's treating doctors initially wound was cleaned anesthetized and we cleaned and closed with running 3-0 Vicryl Rapide.  Patient tolerated well.  Again please see note below for further documentation.   ____________________________________________   INITIAL IMPRESSION / ASSESSMENT AND PLAN / ED COURSE   ----------------------------------------- 9:48 PM on 03/31/2021 ----------------------------------------- Discussed patient in detail with Dr. Logan Bores on-call for OB/GYN.  He asked me to clean out the wound which is basically a repair of the vaginal tear posteriorly and recent so it.  He thinks that this should be good.  I discussed this with the patient she agrees we cleaned out the wound with very dilute Betadine and sterile saline did not see any necrotic tissue or signs of infection.  Wound was then anesthetized with 1% lidocaine with epi and further cleaning was performed.  Still did not see any foreign bodies or signs of infection or necrotic tissue.  The Vicryl repair had  not completely come undone therefore instead of going all the way to the apex of the wound I went above the intact part of the Vicryl repair and began my repair with 3-0 Vicryl Rapide.  I closed the wound and the patient tolerated this well wound was then explored in the vagina I did not feel any defects in the repair and then I repeated the rectal exam which I had done before beginning the repair which showed no rectal defects I did not feel on Chelle for careful palpation and a thread penetrating into the rectum.  There were as before no rectal defects.  I did not see any tears in the sphincter either of the rectum.  Patient tolerated very well.  I will let her go.    ----------------------------------------- 11:16 PM on 03/31/2021 ----------------------------------------- Discussed patient with Dr. Logan Bores.  Blood pressure remains elevated 153/97 with multiple readings in that area.  Patient is resting and feels well.  He wishes me to increase the Adalat or nifedipine 24 pill that she is taking we will  increase it to 60 mg but will split it in half so she takes 1 in the morning 1 in the evening.  She will follow-up with him and I will have her get her blood pressure checked again in the next couple days.  If it remains elevated she can let him know.  She can get the blood pressure checked here or with her primary care doctor or with the emergency squad or even at the [facility] blood pressure checking station.         ____________________________________________   FINAL CLINICAL IMPRESSION(S) / ED DIAGNOSES  Final diagnoses:  Visit for wound check  Actual diagnosis is re-repair of vaginal tear.   ED Discharge Orders     None        Note:  This document was prepared using Dragon voice recognition software and may include unintentional dictation errors.    Arnaldo Natal, MD 03/31/21 [phone]

## 2021-04-02 ENCOUNTER — Telehealth: Payer: Self-pay | Admitting: Obstetrics and Gynecology

## 2021-04-02 NOTE — Telephone Encounter (Signed)
Called patient she is having 8/10 pain with the tear. Recommended that she go back to ED or urgent care and have it repaired again. She does not want to go right now. Pleas advise.

## 2021-04-02 NOTE — Telephone Encounter (Signed)
Pt is calling in stating that she had her baby on Tuesday 03/27/2021 and went to the ER to have a tear repaired and now she stated that she turned over in the bed and thinks that it has torn again on the L side.  Pt would like to have a call back to let her know what she should do.

## 2021-04-02 NOTE — Telephone Encounter (Signed)
Follow up in the morning and if still having issues, will need to be worked into the schedule.  Also, her OB appointment with Dr. Logan Bores on 11/30 needs to be cancelled as she is no longer pregnant.

## 2021-04-03 NOTE — Telephone Encounter (Signed)
I asked Lawana to reach out to patient. She said patient just wants to keep scheduled appointment that she has with Logan Bores tomorrow.

## 2021-04-04 ENCOUNTER — Ambulatory Visit (INDEPENDENT_AMBULATORY_CARE_PROVIDER_SITE_OTHER): Payer: Medicaid Other | Admitting: Obstetrics and Gynecology

## 2021-04-04 ENCOUNTER — Other Ambulatory Visit: Payer: Self-pay

## 2021-04-04 ENCOUNTER — Encounter: Payer: Self-pay | Admitting: Obstetrics and Gynecology

## 2021-04-04 VITALS — BP 166/111 | HR 107 | Ht 62.0 in | Wt 191.0 lb

## 2021-04-04 DIAGNOSIS — S3141XS Laceration without foreign body of vagina and vulva, sequela: Secondary | ICD-10-CM | POA: Diagnosis not present

## 2021-04-04 DIAGNOSIS — O165 Unspecified maternal hypertension, complicating the puerperium: Secondary | ICD-10-CM

## 2021-04-04 DIAGNOSIS — Z8759 Personal history of other complications of pregnancy, childbirth and the puerperium: Secondary | ICD-10-CM

## 2021-04-04 MED ORDER — LIDOCAINE HCL URETHRAL/MUCOSAL 2 % EX GEL: 1.0000 "application " | Freq: Three times a day (TID) | CUTANEOUS | 1 refills | Status: AC | PRN

## 2021-04-04 NOTE — Progress Notes (Signed)
    GYNECOLOGY PROGRESS NOTE  Subjective:    Patient ID: Vickie Carlson, female    DOB: October 20, 1985, 35 y.o.   MRN: [id number]  HPI  Patient is a 35 y.o. [id number] female who presents for complaints of stitches tearing in vaginal area after postpartum repair. She is ~ 1.5 weeks postpartum after an NSVD, pregnancy complicated by pre-eclampsia. Was seen in the ER on 11/26 where she notes they placed 2 additional stitches, however feels that these have torn as well while rolling over in bed.  She is noting moderate discomfort at the site.   The following portions of the patient's history were reviewed and updated as appropriate: allergies, current medications, past family history, past medical history, past social history, past surgical history, and problem list.  Review of Systems Pertinent items noted in HPI and remainder of comprehensive ROS otherwise negative.   Objective:   Blood pressure (!) 166/111, pulse (!) 107, height 5\' 2"  (1.575 m), weight 191 lb (86.6 kg), currently breastfeeding.  Body mass index is 34.93 kg/m. General appearance: alert and no distress Pelvic: external genitalia normal. Vagina with stitches in place, small area of disruption at introitus, ~ 5 mm area of separation.  Tenderness to palpation at 6-7 o'clock position vaginally just beyond introitus. No erythema, discharge present.  Extremities: extremities normal, atraumatic, no cyanosis or edema Neurologic: Grossly normal   Assessment:   1. Vaginal laceration, sequela   2. History of pre-eclampsia      Plan:   - Vaginal laceration - reassured that area was healing properly. Tenderness near new stitch site.  Offered injection with lidocaine for comfort, patient ok to receive. Injected 3 ml of 1% lidocaine to vaginal region, will also prescribe lidocaine gel to use prn for discomfort over the next 1-2 weeks as stitches dissolve.  - H/o pre-eclampsia, patient notes she was late taking her medication today, took it  just prior to coming in for her visit. Also thinks it may be elevated due to her discomfort and being nervous about her visit today. Was increased to 30 mg of Procardia BID after recent ER visit (previously taking just daily) as it was noted to be elevated then.  Notes that she checks her BPs at home and most recently it was 135/80 at home last night. Currently denies headaches, blurred vision, RUQ pain.  Given precautions.  - Will f/u in 1 week for postpartum mood check.     , MD [facility]

## 2021-04-04 NOTE — Patient Instructions (Signed)
Preeclampsia and Eclampsia  Preeclampsia is a serious condition that may develop during pregnancy. This condition involves high blood pressure during pregnancy and causes symptoms such as headaches, vision changes, and increased swelling in the legs, hands, and face. Preeclampsia occurs after 20 weeks of pregnancy. Eclampsia is a seizure that happens from worsening preeclampsia. Diagnosing and managing preeclampsia early is important. If not treated early, it can cause serious problems for mother and baby.  There is no cure for this condition. However, during pregnancy, delivering the baby may be the best treatment for preeclampsia or eclampsia. For most women, symptoms of preeclampsia and eclampsia go away after giving birth. In rare cases, a woman may develop preeclampsia or eclampsia after giving birth. This usually occurs within 48 hours after childbirth but may occur up to 6 weeks after giving birth.  What are the causes?  The cause of this condition is not known.  What increases the risk?  The following factors make you more likely to develop preeclampsia:  Being pregnant for the first time or being pregnant with multiples.  Having had preeclampsia or a condition called hemolysis, elevated liver enzymes, and low platelet count (HELLP)syndrome during a past pregnancy.  Having a family history of preeclampsia.  Being older than age 35.  Being obese.  Becoming pregnant through fertility treatments.  Conditions that reduce blood flow or oxygen to your placenta and baby may also increase your risk. These include:  High blood pressure before, during, or immediately following pregnancy.  Kidney disease.  Diabetes.  Blood clotting disorders.  Autoimmune diseases, such as lupus.  Sleep apnea.  What are the signs or symptoms?  Common symptoms of this condition include:  A severe, throbbing headache that does not go away.  Vision problems, such as blurred or double vision and light sensitivity.  Pain in the stomach,  especially the right upper region.  Pain in the shoulder.  Other symptoms that may develop as the condition gets worse include:  Sudden weight gain because of fluid buildup in the body. This causes swelling of the face, hands, legs, and feet.  Severe nausea and vomiting.  Urinating less than usual.  Shortness of breath.  Seizures.  How is this diagnosed?    Your health care provider will ask you about symptoms and check for signs of preeclampsia during your prenatal visits. You will also have routine tests, including:  Checking your blood pressure.  Urine tests to check for protein.  Blood tests to assess your organ function.  Monitoring your baby's heart rate.  Ultrasounds to check fetal growth.  How is this treated?  You and your health care provider will determine the treatment that is best for you. Treatment may include:  Frequent prenatal visits to check for preeclampsia.  Medicine to lower your blood pressure.  Medicine to prevent seizures.  Low-dose aspirin during your pregnancy.  Staying in the hospital, in severe cases. You will be given medicines to control your blood pressure and the amount of fluids in your body.  Delivering your baby.  Work with your health care provider to manage any chronic health conditions, such as diabetes or kidney problems. Also, work with your health care provider to manage weight gain during pregnancy.  Follow these instructions at home:  Eating and drinking  Drink enough fluid to keep your urine pale yellow.  Avoid caffeine. Caffeine may increase blood pressure and heart rate and lead to dehydration.  Reduce the amount of salt that you eat.  Lifestyle    Do not use any products that contain nicotine or tobacco. These products include cigarettes, chewing tobacco, and vaping devices, such as e-cigarettes. If you need help quitting, ask your health care provider.  Do not use alcohol or drugs.  Avoid stress as much as possible.  Rest and get plenty of sleep.  General  instructions    Take over-the-counter and prescription medicines only as told by your health care provider.  When lying down, lie on your left side. This keeps pressure off your major blood vessels.  When sitting or lying down, raise (elevate) your feet. Try putting pillows underneath your lower legs.  Exercise regularly. Ask your health care provider what kinds of exercise are best for you.  Check your blood pressure as often as recommended by your health care provider.  Keep all prenatal and follow-up visits. This is important.  Contact a health care provider if:  You have symptoms that may need treatment or closer monitoring. These include:  Headaches.  Stomach pain or nausea and vomiting.  Shoulder pain.  Vision problems, such as spots in front of your eyes or blurry vision.  Sudden weight gain or increased swelling in your face, hands, legs, and feet.  Increased anxiety or feeling of impending doom.  Signs or symptoms of labor.  Get help right away if:  You have any of the following symptoms:  A seizure.  Shortness of breath or trouble breathing.  Trouble speaking or slurred speech.  Fainting.  Chest pain.  These symptoms may represent a serious problem that is an emergency. Do not wait to see if the symptoms will go away. Get medical help right away. Call your local emergency services (911 in the [place] Do not drive yourself to the hospital.  Summary  Preeclampsia is a serious condition that may develop during pregnancy.  Diagnosing and treating preeclampsia early is very important.  Keep all prenatal and follow-up visits. This is important.  Get help right away if you have a seizure, shortness of breath or trouble breathing, trouble speaking or slurred speech, chest pain, or fainting.  This information is not intended to replace advice given to you by your health care provider. Make sure you discuss any questions you have with your health care provider.  Document Revised: 01/13/2020 Document Reviewed:  01/13/2020  Elsevier Patient Education  2022 [employer]

## 2021-04-11 ENCOUNTER — Encounter: Payer: Medicaid Other | Admitting: Obstetrics and Gynecology

## 2021-04-11 ENCOUNTER — Encounter: Payer: Self-pay | Admitting: Obstetrics and Gynecology

## 2021-04-11 ENCOUNTER — Telehealth (INDEPENDENT_AMBULATORY_CARE_PROVIDER_SITE_OTHER): Payer: Medicaid Other | Admitting: Obstetrics and Gynecology

## 2021-04-11 DIAGNOSIS — O99345 Other mental disorders complicating the puerperium: Secondary | ICD-10-CM

## 2021-04-11 DIAGNOSIS — Z8659 Personal history of other mental and behavioral disorders: Secondary | ICD-10-CM

## 2021-04-11 DIAGNOSIS — F32A Depression, unspecified: Secondary | ICD-10-CM

## 2021-04-11 DIAGNOSIS — Z1332 Encounter for screening for maternal depression: Secondary | ICD-10-CM

## 2021-04-11 DIAGNOSIS — Z8759 Personal history of other complications of pregnancy, childbirth and the puerperium: Secondary | ICD-10-CM

## 2021-04-11 DIAGNOSIS — S3141XS Laceration without foreign body of vagina and vulva, sequela: Secondary | ICD-10-CM

## 2021-04-11 NOTE — Progress Notes (Signed)
Virtual Visit via Video Note  I connected with [name] on 04/11/21 at 11:30 AM EST by a video enabled telemedicine application and verified that I am speaking with the correct person using two identifiers.  Location: Patient: Home Provider: Office   I discussed the limitations of evaluation and management by telemedicine and the availability of in person appointments. The patient expressed understanding and agreed to proceed.  History of Present Illness: Vickie Carlson is a [id number] female who is a 2 week postpartum mood check and postpartum visit.  She is s/p SVD , induced for pre-eclampsia at 36 weeks, with vaginal sulcal tear, repaired at time of delivery, however had to f/u in the ER several days later due to disruption of her stitches. Also with a h/o depression Was seen in th office last week for BP check and reassessment of vaginal laceration. Was prescribed lidocaine for comfort as it appeared to be healing well, however notes that the pharmacy did not receive it.   Is currently breast and formula feeding, doing well.  Taking BP medication as prescribed (now taking BID Procardia 30 mg).  Denies any symptoms. Checking BPs at home sporadically. Has not resumed intercourse. Desires mini pill for contraception.      Observations/Objective: Blood pressure 140/80, height 5\' 2"  (1.575 m), weight 191 lb (86.6 kg), currently breastfeeding. Gen App: NAD Psych: normal mood and affect, normal speech and thought content   Edinburgh Postnatal Depression Scale - 04/11/21 1139       Edinburgh Postnatal Depression Scale:  In the Past 7 Days   I have been able to laugh and see the funny side of things. 0    I have looked forward with enjoyment to things. 0    I have blamed myself unnecessarily when things went wrong. 1    I have been anxious or worried for no good reason. 0    I have felt scared or panicky for no good reason. 0    Things have been getting on top of me. 1    I have been  so unhappy that I have had difficulty sleeping. 0    I have felt sad or miserable. 0    I have been so unhappy that I have been crying. 0    The thought of harming myself has occurred to me. 0    Edinburgh Postnatal Depression Scale Total 2              Assessment and Plan:  1. Postpartum state s/p SVD (spontaneous vaginal delivery) - Overall doing well. To f/u in 3-4 weeks for postpartum visit.   2. Vaginal laceration, sequela - Notes laceration is doing better. Advised that she can get OTC lidocaine instead of prescription if still needing pain relief.   3. History of pre-eclampsia - Currently on Procardia 30 mg BID.    5. Lactating mother - Doing well with breastfeeding. Also supplementing.   6. History of depression - Currently on Effexor. EDPS is negative, score of 2.   Follow Up Instructions: RTC in 3-4 weeks for final postpartum visit   I discussed the assessment and treatment plan with the patient. The patient was provided an opportunity to ask questions and all were answered. The patient agreed with the plan and demonstrated an understanding of the instructions.   The patient was advised to call back or seek an in-person evaluation if the symptoms worsen or if the condition fails to improve as anticipated.  I provided  7 minutes of virtual face-to-face time during this encounter.   Hildred Laser, MD [facility]

## 2021-04-26 ENCOUNTER — Inpatient Hospital Stay: Admit: 2021-04-26 | Payer: Self-pay

## 2021-05-16 ENCOUNTER — Encounter: Payer: Self-pay | Admitting: Obstetrics and Gynecology

## 2023-07-14 IMAGING — US US OB FOLLOW-UP
2 series · 15 of 28 positions shown · non-contrast
Comparison: none

CLINICAL DATA: 35-year-old pregnant female presents for completion
of fetal anatomic survey.

EXAM:
OBSTETRIC 14+ WK ULTRASOUND FOLLOW-UP

[Series 1: us ob follow up · 14 of 62 slices shown (1 of 2)]
[im 1/62]
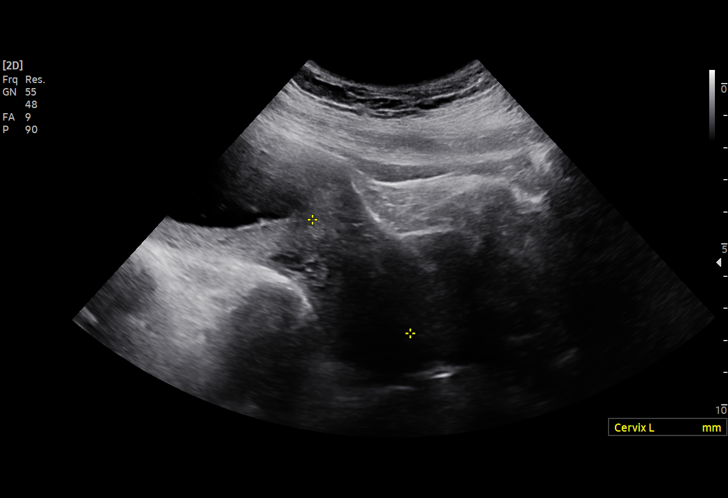
[im 5/62]
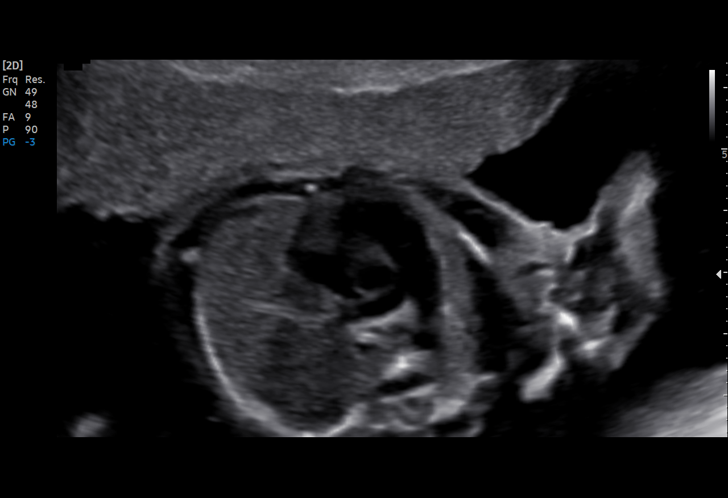
[im 10/62]
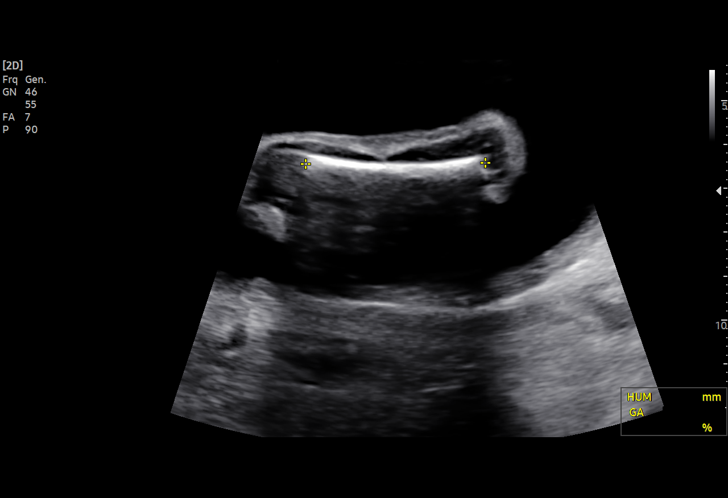
[im 15/62]
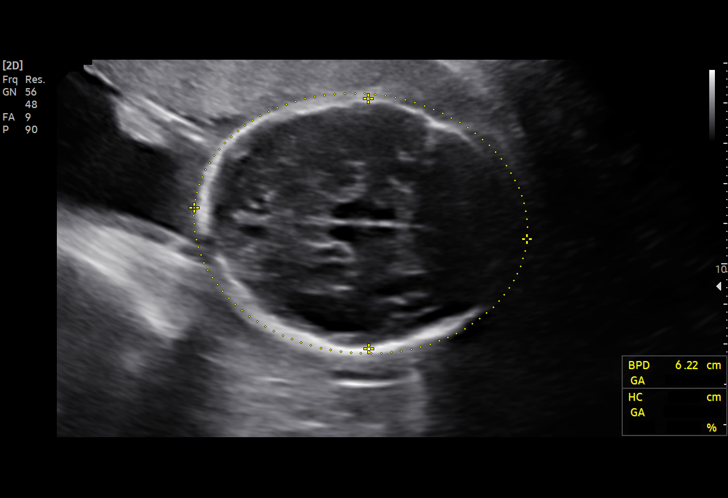
[im 19/62]
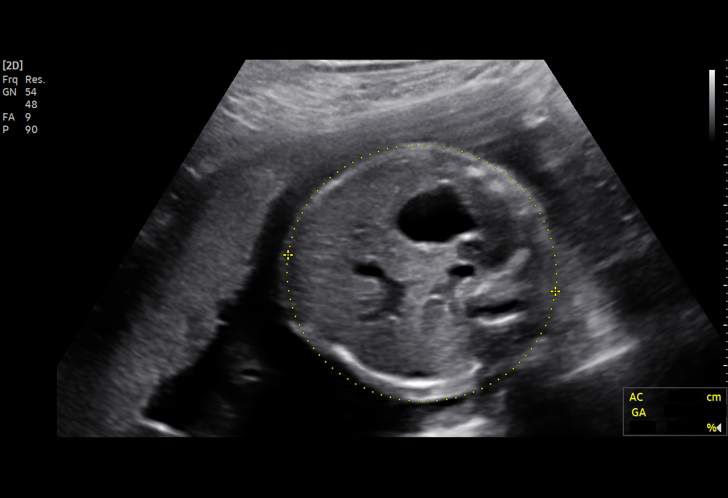
[im 24/62]
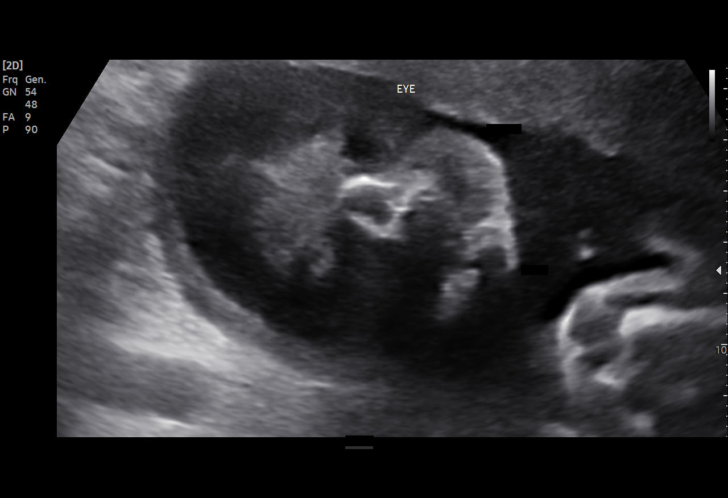
[im 29/62]
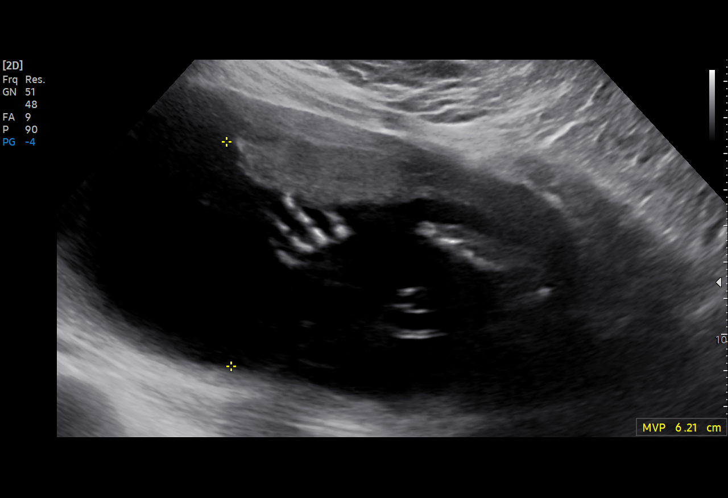
[im 33/62]
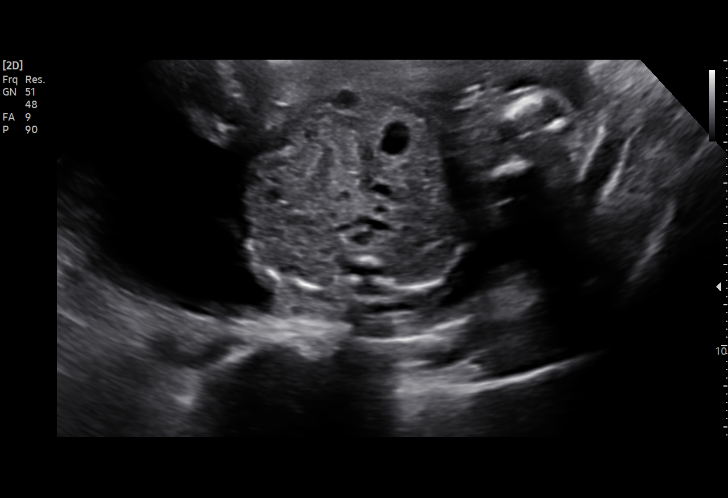
[im 36/62]
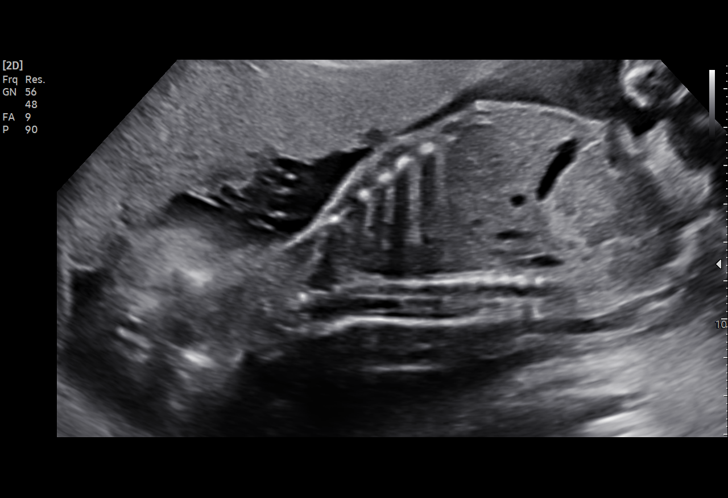
[im 40/62]
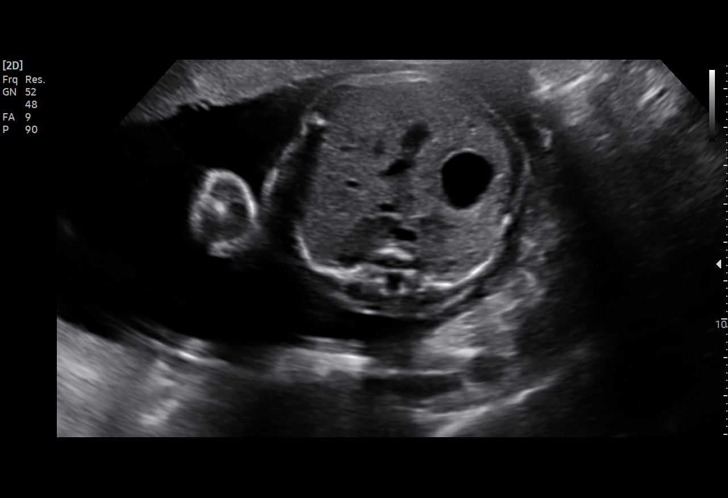
[im 45/62]
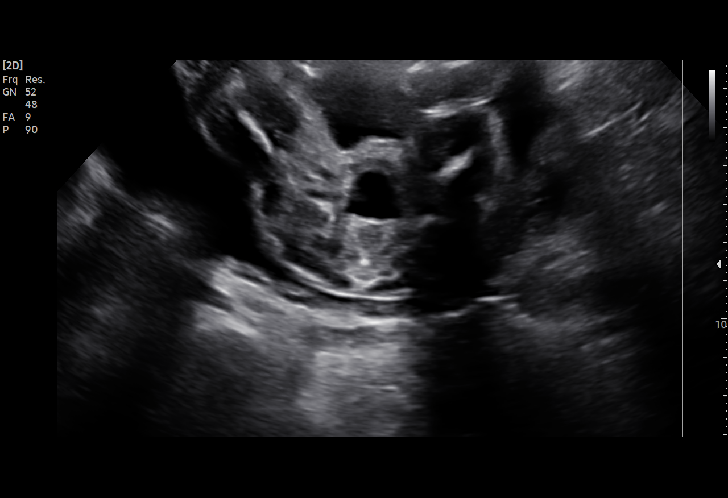
[im 50/62]
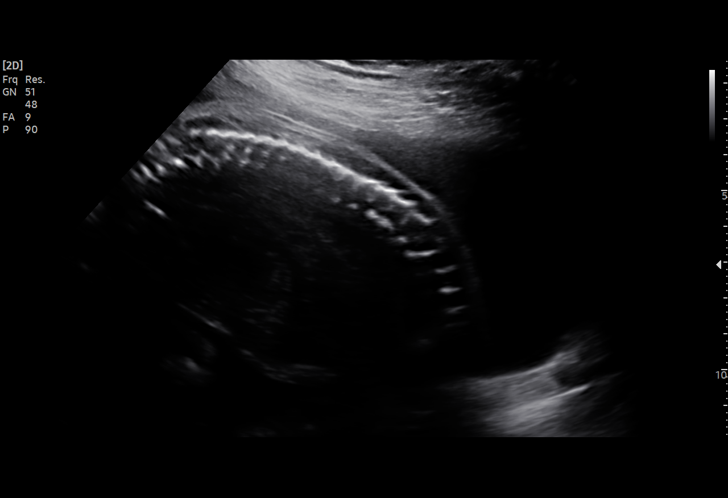
[im 54/62]
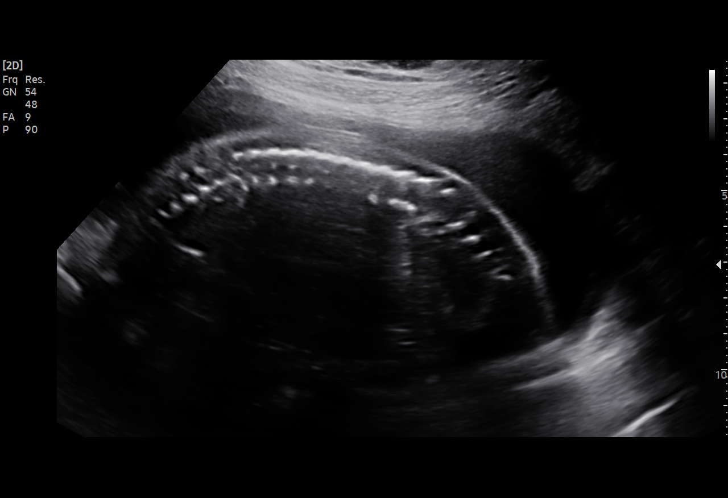
[im 59/62]
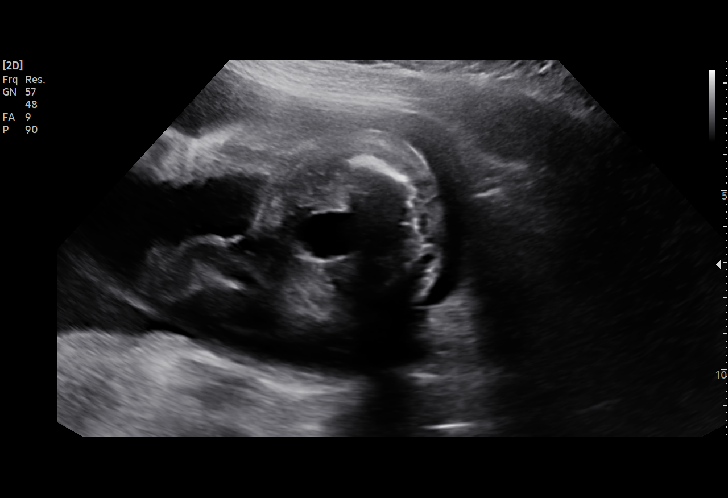

[Series 3: us ob follow up · 1 of 1 slices shown (2 of 2)]
[im 1/1]
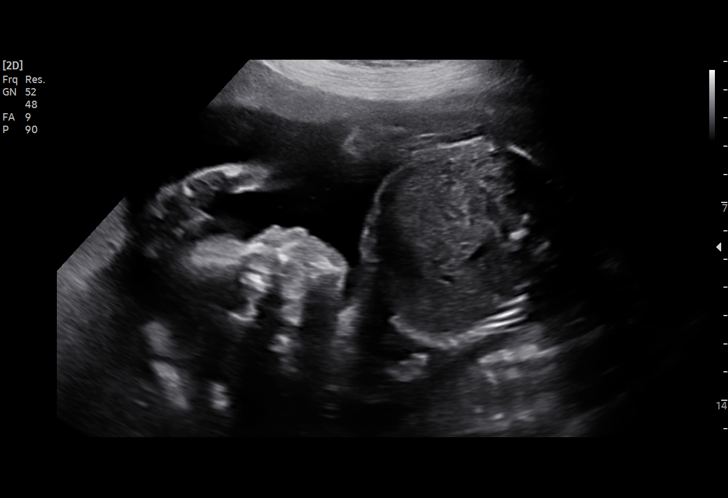

[15 of 28 positions shown; findings below may reference images not displayed]

FINDINGS: Number of Fetuses: 1

Heart Rate:  143 bpm

Movement: Yes

Presentation: Transverse with fetal head on maternal right

Previa: No

Placental Location: Anterior

Amniotic Fluid (Subjective): Normal

Amniotic Fluid (Objective):

Vertical pocket 6.2cm

FETAL BIOMETRY

BPD:  6.4cm 26w 0d

HC:    23.4cm 25w 3d

AC:    20.2cm 24w 6d

FL:    4.4cm 24w d

Current Mean GA: 25w 0d US EDC: 04/20/2021

Assigned GA: 24w 6d Assigned EDC: 04/21/2021

Estimated Fetal Weight:  731g 50.7%ile

FETAL ANATOMY

Lateral Ventricles: Appears normal

Thalami/CSP: Appears normal

Posterior Fossa: Previously seen

Nuchal Region: Previously seen

Upper Lip: Appears normal

Spine: Previously seen

4 Chamber Heart on Left: Appears normal

LVOT: Appears normal

RVOT: Appears normal

Stomach on Left: Appears normal

3 Vessel Cord: Appears normal

Cord Insertion site: Previously seen

Kidneys: Appears normal

Bladder: Appears normal

Extremities: Previously seen

Sex: Male external genitalia

Maternal Findings:

Cervix: Cervix length approximately 4.7 cm on transabdominal views
with no evidence of internal cervical funneling.
IMPRESSION: 1. Single living intrauterine gestation in transverse lie at 25
weeks 0 days by average ultrasound age, with appropriate interval
fetal growth.
2. No fetal or maternal abnormalities detected.
